# Patient Record
Sex: Female | Born: 1975 | Race: White | Hispanic: No | Marital: Single | Smoking: Current some day smoker
Health system: Southern US, Community
[De-identification: ages and names within clinical notes are randomized; demographics above are authoritative.]

## PROBLEM LIST (undated history)

## (undated) DIAGNOSIS — A499 Bacterial infection, unspecified: Secondary | ICD-10-CM

## (undated) DIAGNOSIS — N390 Urinary tract infection, site not specified: Secondary | ICD-10-CM

## (undated) HISTORY — DX: Urinary tract infection, site not specified: N39.0

## (undated) HISTORY — DX: Urinary tract infection, site not specified: A49.9

## (undated) HISTORY — DX: Bacterial infection, unspecified: A49.9

---

## 1998-11-03 HISTORY — PX: OTHER SURGICAL HISTORY: SHX169

## 1998-11-21 ENCOUNTER — Other Ambulatory Visit: Admission: RE | Admit: 1998-11-21 | Discharge: 1998-11-21 | Payer: Self-pay | Admitting: Obstetrics and Gynecology

## 1999-06-22 ENCOUNTER — Inpatient Hospital Stay (HOSPITAL_COMMUNITY): Admission: AD | Admit: 1999-06-22 | Discharge: 1999-06-24 | Payer: Self-pay | Admitting: *Deleted

## 1999-12-31 ENCOUNTER — Other Ambulatory Visit: Admission: RE | Admit: 1999-12-31 | Discharge: 1999-12-31 | Payer: Self-pay | Admitting: Obstetrics & Gynecology

## 2001-01-28 ENCOUNTER — Other Ambulatory Visit: Admission: RE | Admit: 2001-01-28 | Discharge: 2001-01-28 | Payer: Self-pay | Admitting: Obstetrics and Gynecology

## 2001-03-17 ENCOUNTER — Other Ambulatory Visit: Admission: RE | Admit: 2001-03-17 | Discharge: 2001-03-17 | Payer: Self-pay | Admitting: Obstetrics and Gynecology

## 2001-03-18 ENCOUNTER — Encounter (INDEPENDENT_AMBULATORY_CARE_PROVIDER_SITE_OTHER): Payer: Self-pay

## 2001-03-18 ENCOUNTER — Other Ambulatory Visit: Admission: RE | Admit: 2001-03-18 | Discharge: 2001-03-18 | Payer: Self-pay | Admitting: Obstetrics and Gynecology

## 2001-04-07 ENCOUNTER — Other Ambulatory Visit: Admission: RE | Admit: 2001-04-07 | Discharge: 2001-04-07 | Payer: Self-pay | Admitting: Obstetrics and Gynecology

## 2001-08-06 ENCOUNTER — Other Ambulatory Visit: Admission: RE | Admit: 2001-08-06 | Discharge: 2001-08-06 | Payer: Self-pay | Admitting: Obstetrics and Gynecology

## 2001-10-28 ENCOUNTER — Other Ambulatory Visit: Admission: RE | Admit: 2001-10-28 | Discharge: 2001-10-28 | Payer: Self-pay | Admitting: Obstetrics and Gynecology

## 2002-01-18 ENCOUNTER — Other Ambulatory Visit: Admission: RE | Admit: 2002-01-18 | Discharge: 2002-01-18 | Payer: Self-pay | Admitting: Obstetrics and Gynecology

## 2002-04-11 ENCOUNTER — Other Ambulatory Visit: Admission: RE | Admit: 2002-04-11 | Discharge: 2002-04-11 | Payer: Self-pay | Admitting: Obstetrics and Gynecology

## 2002-05-14 ENCOUNTER — Encounter: Payer: Self-pay | Admitting: Emergency Medicine

## 2002-05-14 ENCOUNTER — Emergency Department (HOSPITAL_COMMUNITY): Admission: EM | Admit: 2002-05-14 | Discharge: 2002-05-14 | Payer: Self-pay | Admitting: Emergency Medicine

## 2003-11-04 HISTORY — PX: INDUCED ABORTION: SHX677

## 2004-11-03 HISTORY — PX: OTHER SURGICAL HISTORY: SHX169

## 2004-12-24 ENCOUNTER — Other Ambulatory Visit: Admission: RE | Admit: 2004-12-24 | Discharge: 2004-12-24 | Payer: Self-pay | Admitting: Obstetrics and Gynecology

## 2005-06-13 ENCOUNTER — Encounter (INDEPENDENT_AMBULATORY_CARE_PROVIDER_SITE_OTHER): Payer: Self-pay | Admitting: Specialist

## 2005-06-13 ENCOUNTER — Ambulatory Visit (HOSPITAL_COMMUNITY): Admission: RE | Admit: 2005-06-13 | Discharge: 2005-06-13 | Payer: Self-pay | Admitting: Obstetrics and Gynecology

## 2005-06-18 ENCOUNTER — Inpatient Hospital Stay (HOSPITAL_COMMUNITY): Admission: AD | Admit: 2005-06-18 | Discharge: 2005-06-18 | Payer: Self-pay | Admitting: Obstetrics and Gynecology

## 2006-08-12 ENCOUNTER — Other Ambulatory Visit: Admission: RE | Admit: 2006-08-12 | Discharge: 2006-08-12 | Payer: Self-pay | Admitting: Obstetrics and Gynecology

## 2006-11-03 HISTORY — PX: OTHER SURGICAL HISTORY: SHX169

## 2006-12-18 ENCOUNTER — Inpatient Hospital Stay (HOSPITAL_COMMUNITY): Admission: AD | Admit: 2006-12-18 | Discharge: 2006-12-18 | Payer: Self-pay | Admitting: Obstetrics and Gynecology

## 2007-02-10 ENCOUNTER — Inpatient Hospital Stay (HOSPITAL_COMMUNITY): Admission: AD | Admit: 2007-02-10 | Discharge: 2007-02-10 | Payer: Self-pay | Admitting: Obstetrics and Gynecology

## 2007-02-23 ENCOUNTER — Inpatient Hospital Stay (HOSPITAL_COMMUNITY): Admission: AD | Admit: 2007-02-23 | Discharge: 2007-02-25 | Payer: Self-pay | Admitting: Obstetrics and Gynecology

## 2007-04-06 ENCOUNTER — Other Ambulatory Visit: Admission: RE | Admit: 2007-04-06 | Discharge: 2007-04-06 | Payer: Self-pay | Admitting: Obstetrics and Gynecology

## 2007-08-26 ENCOUNTER — Other Ambulatory Visit: Admission: RE | Admit: 2007-08-26 | Discharge: 2007-08-26 | Payer: Self-pay | Admitting: Obstetrics and Gynecology

## 2011-03-18 NOTE — Discharge Summary (Signed)
NAME:  Pamela Ochoa, Pamela Ochoa NO.:  [id number]   MEDICAL RECORD NO.:  [id number]          PATIENT TYPE:  INP   LOCATION:  9112                          FACILITY:  [facility]   PHYSICIAN:  Rudy Jew. Ashley Royalty, M.D.DATE OF BIRTH:  1976/06/11   DATE OF ADMISSION:  02/23/2007  DATE OF DISCHARGE:  02/25/2007                               DISCHARGE SUMMARY   DISCHARGE DIAGNOSIS:  1. Intrauterine pregnancy at 9 weeks' gestation, delivered  2. Term birth living child, vertex.   OPERATIONS AND PROCEDURES:  OB delivery   CONSULTATIONS:  None.   DISCHARGE MEDICATIONS:  None.   HISTORY AND PHYSICAL:  This is a 35 year old gravida 3, para 1-0-1-1 at  [redacted] weeks gestation.  The patient presented for induction secondary to  advanced cervical changes and musculoskeletal  discomfort.  Cervix was 4  cm dilated.   HISTORY AND PHYSICAL:  Please see chart.   HOSPITAL COURSE:  The patient was admitted to [facility] of  [place].  Initial laboratory studies were drawn.  Artificial rupture  of membranes was accomplished which revealed clear fluid.  IUPC was  placed.  The patient went on to deliver on 02/23/2007.  The infant was a  6 pounds 3 ounces female, Apgars 9 at 1 minute, 9 at 5 minutes, sent to  newborn nursery.  Delivery was accomplished by Dr. Sylvester Harder over  an intact perineum.  There were bilateral periurethral skids which  were easily closed.  The patient's postpartum course was benign.  She  was discharged on the second postpartum day, afebrile and in  satisfactory condition.   DISPOSITION:  The patient is to return to [facility] obstetrics in 4-6  weeks for postpartum evaluation.      James A. Ashley Royalty, M.D.  Electronically Signed     JAM/MEDQ  D:  03/31/2007  T:  03/31/2007  Job:  [id number]

## 2011-03-21 NOTE — Op Note (Signed)
NAME:  Pamela Ochoa, CRIMI NO.:  [id number]   MEDICAL RECORD NO.:  [id number]          PATIENT TYPE:  AMB   LOCATION:  SDC                           FACILITY:  [facility]   PHYSICIAN:  Crist Fat. Rivard, M.D. DATE OF BIRTH:  [birth year]   DATE OF PROCEDURE:  DATE OF DISCHARGE:                                 OPERATIVE REPORT   PREOPERATIVE DIAGNOSIS:  Missed abortion.   POSTOPERATIVE DIAGNOSIS:  Missed abortion.   ANESTHESIA:  Sedation.   PROCEDURE:  Dilation and evacuation.   SURGEON:  Dr. Estanislado Pandy.   ESTIMATED BLOOD LOSS:  Minimal.   PROCEDURE:  After being informed of the planned procedure with possible  complications including bleeding, infection, and injury to uterus, informed  consent was obtained. The patient was taken to OR #3 given IV sedation,  placed in the lithotomy position, prepped and draped in a sterile fashion,  and her bladder was emptied with an in-and-out Foley catheter. GYN exam  reveals an anteverted uterus 12-13 weeks' size, flared cervix, two normal  adnexa.  A weighted speculum was inserted.  The  anterior lip of the cervix  was grasped with a tenaculum forceps and paracervical block is performed  using Nesacaine 1% 20 mL in the usual fashion. The uterus was then sounded  at 12 cm and the cervix was easily dilated using Hegar dilators at #31. This  allowed easy entry of a #9 curved cannula and evacuation section of products  of conception. After evacuation was completed, a sharp curette is used to  assess the uterine cavity which is felt to be free of any debris.   Instruments are removed.  Instruments and sponge count is complete x2.  Estimated blood loss was minimal. Procedures was well tolerated by the  patient who was taken to recovery room in a well and stable condition.      Crist Fat Rivard, M.D.  Electronically Signed     SAR/MEDQ  D:  06/13/2005  T:  06/13/2005  Job:  [id number]

## 2014-11-03 HISTORY — PX: INDUCED ABORTION: SHX677

## 2015-10-17 ENCOUNTER — Encounter: Payer: Self-pay | Admitting: *Deleted

## 2015-10-17 ENCOUNTER — Encounter: Payer: Self-pay | Admitting: Neurology

## 2015-10-17 ENCOUNTER — Ambulatory Visit (INDEPENDENT_AMBULATORY_CARE_PROVIDER_SITE_OTHER): Payer: Self-pay | Admitting: Neurology

## 2015-10-17 VITALS — BP 110/70 | HR 80 | Ht 63.0 in | Wt 123.4 lb

## 2015-10-17 DIAGNOSIS — F0781 Postconcussional syndrome: Secondary | ICD-10-CM

## 2015-10-17 DIAGNOSIS — H9319 Tinnitus, unspecified ear: Secondary | ICD-10-CM

## 2015-10-17 DIAGNOSIS — F32A Depression, unspecified: Secondary | ICD-10-CM

## 2015-10-17 DIAGNOSIS — F411 Generalized anxiety disorder: Secondary | ICD-10-CM

## 2015-10-17 DIAGNOSIS — H539 Unspecified visual disturbance: Secondary | ICD-10-CM

## 2015-10-17 DIAGNOSIS — F329 Major depressive disorder, single episode, unspecified: Secondary | ICD-10-CM

## 2015-10-17 DIAGNOSIS — G4489 Other headache syndrome: Secondary | ICD-10-CM

## 2015-10-17 DIAGNOSIS — R42 Dizziness and giddiness: Secondary | ICD-10-CM

## 2015-10-17 DIAGNOSIS — S0990XA Unspecified injury of head, initial encounter: Secondary | ICD-10-CM

## 2015-10-17 DIAGNOSIS — H9192 Unspecified hearing loss, left ear: Secondary | ICD-10-CM

## 2015-10-17 NOTE — Patient Instructions (Signed)
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
## 2015-10-19 ENCOUNTER — Ambulatory Visit (INDEPENDENT_AMBULATORY_CARE_PROVIDER_SITE_OTHER): Payer: Self-pay

## 2015-10-19 DIAGNOSIS — S0990XA Unspecified injury of head, initial encounter: Secondary | ICD-10-CM

## 2015-10-19 DIAGNOSIS — H9319 Tinnitus, unspecified ear: Secondary | ICD-10-CM

## 2015-10-19 DIAGNOSIS — G4489 Other headache syndrome: Secondary | ICD-10-CM

## 2015-10-19 DIAGNOSIS — H9192 Unspecified hearing loss, left ear: Secondary | ICD-10-CM

## 2015-10-19 DIAGNOSIS — R42 Dizziness and giddiness: Secondary | ICD-10-CM

## 2015-10-19 DIAGNOSIS — Z0289 Encounter for other administrative examinations: Secondary | ICD-10-CM

## 2015-10-19 DIAGNOSIS — H539 Unspecified visual disturbance: Secondary | ICD-10-CM

## 2015-10-19 DIAGNOSIS — F0781 Postconcussional syndrome: Secondary | ICD-10-CM

## 2015-10-22 ENCOUNTER — Telehealth: Payer: Self-pay | Admitting: *Deleted

## 2015-10-22 NOTE — Telephone Encounter (Signed)
-----   Message from Anson FretAntonia B Ahern, MD sent at 10/21/2015 12:16 PM EST ----- Normal MRi of the brain thanks!

## 2015-10-22 NOTE — Telephone Encounter (Signed)
Called pt about normal MRA per Dr Lucia GaskinsAhern notes. Pt verbalized understanding. No further questions at this time.

## 2015-10-22 NOTE — Telephone Encounter (Signed)
Called and spoke to pt about normal MRI brain per Dr Ahern. Pt verbalized understanding.  

## 2015-10-22 NOTE — Telephone Encounter (Signed)
-----   Message from Anson FretAntonia B Ahern, MD sent at 10/22/2015  1:26 PM EST ----- MRA normal, no aneurysms or vascular anomalies or stenosis. Normal.  thanks

## 2017-01-21 ENCOUNTER — Ambulatory Visit (INDEPENDENT_AMBULATORY_CARE_PROVIDER_SITE_OTHER): Payer: Medicaid Other | Admitting: Obstetrics

## 2017-01-21 ENCOUNTER — Other Ambulatory Visit (HOSPITAL_COMMUNITY)
Admission: RE | Admit: 2017-01-21 | Discharge: 2017-01-21 | Disposition: A | Discharge status: Home / Self Care | Payer: Medicaid Other | Source: Ambulatory Visit | Attending: Obstetrics | Admitting: Obstetrics

## 2017-01-21 ENCOUNTER — Encounter: Payer: Self-pay | Admitting: Obstetrics

## 2017-01-21 VITALS — BP 128/84 | HR 71 | Ht 63.0 in | Wt 124.0 lb

## 2017-01-21 DIAGNOSIS — Z0000 Encounter for general adult medical examination without abnormal findings: Secondary | ICD-10-CM | POA: Diagnosis not present

## 2017-01-21 DIAGNOSIS — Z124 Encounter for screening for malignant neoplasm of cervix: Secondary | ICD-10-CM | POA: Diagnosis present

## 2017-01-21 DIAGNOSIS — Z01419 Encounter for gynecological examination (general) (routine) without abnormal findings: Secondary | ICD-10-CM | POA: Diagnosis not present

## 2017-01-21 DIAGNOSIS — Z113 Encounter for screening for infections with a predominantly sexual mode of transmission: Secondary | ICD-10-CM

## 2017-01-21 NOTE — Progress Notes (Signed)
Pt presents for annual, pap, all STD testing. No concerns per pt today.

## 2017-01-21 NOTE — Progress Notes (Signed)
Subjective:        Pamela EavesBrandi A Ochoa is a 41 y.o. female here for a routine exam.  Current complaints: None.    Personal health questionnaire:  Is patient [name], have a family history of breast and/or ovarian cancer: no Is there a family history of uterine cancer diagnosed at age < 5750, gastrointestinal cancer, urinary tract cancer, family member who is a Personnel officerLynch syndrome-associated carrier: no Is the patient overweight and hypertensive, family history of diabetes, personal history of gestational diabetes, preeclampsia or PCOS: no Is patient over 1155, have PCOS,  family history of premature CHD under age 39, diabetes, smoke, have hypertension or peripheral artery disease:  no At any time, has a partner hit, kicked or otherwise hurt or frightened you?: no Over the past 2 weeks, have you felt down, depressed or hopeless?: no Over the past 2 weeks, have you felt little interest or pleasure in doing things?:no   Gynecologic History Patient's last menstrual period was 01/15/2017. Contraception: vasectomy Last Pap: Unknown. Results were: normal Last mammogram: n/a. Results were: n/a  Obstetric History OB History  Gravida Para Term Preterm AB Living  5 2 2   3 2   SAB TAB Ectopic Multiple Live Births  1 2     2     # Outcome Date GA Lbr Len/2nd Weight Sex Delivery Anes PTL Lv  5 Term 02/23/07 2055w0d   F Vag-Spont EPI    4 Term 06/22/99 325w0d   M Vag-Spont EPI  LIV  3 SAB           2 TAB           1 TAB               Past Medical History:  Diagnosis Date  . Urinary tract bacterial infections     Past Surgical History:  Procedure Laterality Date  . INDUCED ABORTION  2005  . INDUCED ABORTION  2016  . miscarriage  2006  . OTHER SURGICAL HISTORY  2008   Birth  . vaginal birth  2000    No current outpatient prescriptions on file. Allergies  Allergen Reactions  . Other     Pain pills- makes her twitch really bad    Social History  Substance Use Topics  . Smoking  status: Never Smoker  . Smokeless tobacco: Never Used  . Alcohol use 0.0 oz/week     Comment: rare    Family History  Problem Relation Age of Onset  . Heart disease    . Diabetes    . Hypertension        Review of Systems  Constitutional: negative for fatigue and weight loss Respiratory: negative for cough and wheezing Cardiovascular: negative for chest pain, fatigue and palpitations Gastrointestinal: negative for abdominal pain and change in bowel habits Musculoskeletal:negative for myalgias Neurological: negative for gait problems and tremors Behavioral/Psych: negative for abusive relationship, depression Endocrine: negative for temperature intolerance    Genitourinary:negative for abnormal menstrual periods, genital lesions, hot flashes, sexual problems and vaginal discharge Integument/breast: negative for breast lump, breast tenderness, nipple discharge and skin lesion(s)    Objective:       BP 128/84   Pulse 71   Ht 5\' 3"  (1.6 m)   Wt 124 lb (56.2 kg)   LMP 01/15/2017   BMI 21.97 kg/m  General:   alert  Skin:   no rash or abnormalities  Lungs:   clear to auscultation bilaterally  Heart:   regular rate  and rhythm, S1, S2 normal, no murmur, click, rub or gallop  Breasts:   normal without suspicious masses, skin or nipple changes or axillary nodes  Abdomen:  normal findings: no organomegaly, soft, non-tender and no hernia  Pelvis:  External genitalia: normal general appearance Urinary system: urethral meatus normal and bladder without fullness, nontender Vaginal: normal without tenderness, induration or masses Cervix: normal appearance Adnexa: normal bimanual exam Uterus: anteverted and non-tender, normal size   Lab Review Urine pregnancy test Labs reviewed yes Radiologic studies reviewed no  50% of 20 min visit spent on counseling and coordination of care.    Assessment:    Healthy female exam.    Plan:    Education reviewed: calcium supplements, low  fat, low cholesterol diet, safe sex/STD prevention, self breast exams and weight bearing exercise. Contraception: vasectomy. Follow up in: 1 year.   No orders of the defined types were placed in this encounter.  Orders Placed This Encounter  Procedures  . Hepatitis B surface antigen  . Hepatitis C antibody  . HIV antibody  . RPR     Patient ID: Pamela Ochoa, female   DOB: September 02, 1976, 41 y.o.   MRN: [id number]

## 2017-01-22 LAB — CERVICOVAGINAL ANCILLARY ONLY
Bacterial vaginitis: POSITIVE — AB
Candida vaginitis: NEGATIVE
Chlamydia: NEGATIVE
Neisseria Gonorrhea: NEGATIVE
Trichomonas: POSITIVE — AB

## 2017-01-22 LAB — HEPATITIS B SURFACE ANTIGEN: Hepatitis B Surface Ag: NEGATIVE

## 2017-01-22 LAB — RPR: RPR: NONREACTIVE

## 2017-01-22 LAB — HIV ANTIBODY (ROUTINE TESTING W REFLEX): HIV SCREEN 4TH GENERATION: NONREACTIVE

## 2017-01-22 LAB — HEPATITIS C ANTIBODY: Hep C Virus Ab: <0.1 s/co ratio (ref 0.0–0.9)

## 2017-01-23 ENCOUNTER — Other Ambulatory Visit: Payer: Self-pay | Admitting: Obstetrics

## 2017-01-23 DIAGNOSIS — B9689 Other specified bacterial agents as the cause of diseases classified elsewhere: Secondary | ICD-10-CM

## 2017-01-23 DIAGNOSIS — A5901 Trichomonal vulvovaginitis: Secondary | ICD-10-CM

## 2017-01-23 DIAGNOSIS — N760 Acute vaginitis: Principal | ICD-10-CM

## 2017-01-23 MED ORDER — TINIDAZOLE 500 MG PO TABS: 2.0000 g | ORAL_TABLET | Freq: Every day | ORAL | 1 refills | Status: DC

## 2017-01-26 LAB — CYTOLOGY - PAP
Diagnosis: NEGATIVE
HPV (WINDOPATH): NOT DETECTED

## 2017-03-25 ENCOUNTER — Ambulatory Visit (INDEPENDENT_AMBULATORY_CARE_PROVIDER_SITE_OTHER): Payer: Medicaid Other | Admitting: Obstetrics

## 2017-03-25 ENCOUNTER — Other Ambulatory Visit (HOSPITAL_COMMUNITY)
Admission: RE | Admit: 2017-03-25 | Discharge: 2017-03-25 | Disposition: A | Discharge status: Home / Self Care | Payer: Medicaid Other | Source: Ambulatory Visit | Attending: Obstetrics | Admitting: Obstetrics

## 2017-03-25 ENCOUNTER — Encounter: Payer: Self-pay | Admitting: Obstetrics

## 2017-03-25 VITALS — BP 121/83 | HR 71 | Wt 123.8 lb

## 2017-03-25 DIAGNOSIS — N898 Other specified noninflammatory disorders of vagina: Secondary | ICD-10-CM

## 2017-03-25 DIAGNOSIS — Z113 Encounter for screening for infections with a predominantly sexual mode of transmission: Secondary | ICD-10-CM

## 2017-03-25 DIAGNOSIS — N760 Acute vaginitis: Secondary | ICD-10-CM | POA: Insufficient documentation

## 2017-03-25 DIAGNOSIS — B9689 Other specified bacterial agents as the cause of diseases classified elsewhere: Secondary | ICD-10-CM | POA: Insufficient documentation

## 2017-03-25 NOTE — Progress Notes (Signed)
Patient ID: Pamela Ochoa, female   DOB: 1976-08-10, 41 y.o.   MRN: [id number]  Chief Complaint  Patient presents with  . Follow-up    TOC    HPI Pamela Ochoa is a 41 y.o. female.  History of positive Trichomonas and BV, treated.  Partner was treated also.  Presents today for follow up TOC. HPI  Past Medical History:  Diagnosis Date  . Urinary tract bacterial infections     Past Surgical History:  Procedure Laterality Date  . INDUCED ABORTION  2005  . INDUCED ABORTION  2016  . miscarriage  2006  . OTHER SURGICAL HISTORY  2008   Birth  . vaginal birth  212000    Family History  Problem Relation Age of Onset  . Heart disease Unknown   . Diabetes Unknown   . Hypertension Unknown     Social History Social History  Substance Use Topics  . Smoking status: Never Smoker  . Smokeless tobacco: Never Used  . Alcohol use 0.0 oz/week     Comment: rare    Allergies  Allergen Reactions  . Other     Pain pills- makes her twitch really bad    Current Outpatient Prescriptions  Medication Sig Dispense Refill  . tinidazole (TINDAMAX) 500 MG tablet Take 4 tablets (2,000 mg total) by mouth daily with breakfast. 8 tablet 1   No current facility-administered medications for this visit.     Review of Systems Review of Systems Constitutional: negative for fatigue and weight loss Respiratory: negative for cough and wheezing Cardiovascular: negative for chest pain, fatigue and palpitations Gastrointestinal: negative for abdominal pain and change in bowel habits Genitourinary:positive for vaginal discharge and irritation Integument/breast: negative for nipple discharge Musculoskeletal:negative for myalgias Neurological: negative for gait problems and tremors Behavioral/Psych: negative for abusive relationship, depression Endocrine: negative for temperature intolerance      Blood pressure 121/83, pulse 71, weight 123 lb 12.8 oz (56.2 kg), last menstrual period  03/12/2017.  Physical Exam Physical Exam           General:  Alert and no distress Abdomen:  normal findings: no organomegaly, soft, non-tender and no hernia  Pelvis:  External genitalia: normal general appearance Urinary system: urethral meatus normal and bladder without fullness, nontender Vaginal: normal without tenderness, induration or masses Cervix: normal appearance Adnexa: normal bimanual exam Uterus: anteverted and non-tender, normal size    50% of 15 min visit spent on counseling and coordination of care.    Data Reviewed Labs  Assessment     History of positive Trichomonas and BV, treated. Follow up STD screen and TOC    Plan    Wet prep and cultures done F/U prn and in 1 year for Annual  No orders of the defined types were placed in this encounter.  No orders of the defined types were placed in this encounter.

## 2017-03-25 NOTE — Progress Notes (Signed)
Patient presents for TOC +BV and Trich. Her partner was also treated.

## 2017-03-26 LAB — CERVICOVAGINAL ANCILLARY ONLY
Bacterial vaginitis: POSITIVE — AB
CHLAMYDIA, DNA PROBE: NEGATIVE
Candida vaginitis: NEGATIVE
Neisseria Gonorrhea: NEGATIVE
Trichomonas: NEGATIVE

## 2017-03-27 ENCOUNTER — Other Ambulatory Visit: Payer: Self-pay | Admitting: Obstetrics

## 2018-02-11 ENCOUNTER — Ambulatory Visit (INDEPENDENT_AMBULATORY_CARE_PROVIDER_SITE_OTHER): Payer: Medicaid Other | Admitting: Obstetrics

## 2018-02-11 ENCOUNTER — Other Ambulatory Visit (HOSPITAL_COMMUNITY)
Admission: RE | Admit: 2018-02-11 | Discharge: 2018-02-11 | Disposition: A | Discharge status: Home / Self Care | Payer: Medicaid Other | Source: Ambulatory Visit | Attending: Obstetrics | Admitting: Obstetrics

## 2018-02-11 ENCOUNTER — Encounter: Payer: Self-pay | Admitting: Obstetrics

## 2018-02-11 VITALS — BP 126/75 | HR 78 | Ht 63.0 in | Wt 126.0 lb

## 2018-02-11 DIAGNOSIS — Z309 Encounter for contraceptive management, unspecified: Secondary | ICD-10-CM

## 2018-02-11 DIAGNOSIS — N898 Other specified noninflammatory disorders of vagina: Secondary | ICD-10-CM | POA: Insufficient documentation

## 2018-02-11 DIAGNOSIS — Z1239 Encounter for other screening for malignant neoplasm of breast: Secondary | ICD-10-CM

## 2018-02-11 DIAGNOSIS — F5231 Female orgasmic disorder: Secondary | ICD-10-CM

## 2018-02-11 DIAGNOSIS — IMO0002 Reserved for concepts with insufficient information to code with codable children: Secondary | ICD-10-CM

## 2018-02-11 DIAGNOSIS — Z01419 Encounter for gynecological examination (general) (routine) without abnormal findings: Secondary | ICD-10-CM | POA: Diagnosis present

## 2018-02-11 DIAGNOSIS — Z1151 Encounter for screening for human papillomavirus (HPV): Secondary | ICD-10-CM | POA: Insufficient documentation

## 2018-02-11 DIAGNOSIS — Z01411 Encounter for gynecological examination (general) (routine) with abnormal findings: Secondary | ICD-10-CM | POA: Insufficient documentation

## 2018-02-11 DIAGNOSIS — Z3009 Encounter for other general counseling and advice on contraception: Secondary | ICD-10-CM

## 2018-02-11 MED ORDER — SECNIDAZOLE 2 G PO PACK: 1.0000 | PACK | Freq: Once | ORAL | 2 refills | Status: DC

## 2018-02-11 MED ORDER — IBUPROFEN 800 MG PO TABS: 800.0000 mg | ORAL_TABLET | Freq: Three times a day (TID) | ORAL | 5 refills | Status: DC | PRN

## 2018-02-11 MED ORDER — METRONIDAZOLE 500 MG PO TABS: 500.0000 mg | ORAL_TABLET | Freq: Two times a day (BID) | ORAL | 2 refills | Status: DC

## 2018-02-11 NOTE — Progress Notes (Signed)
Patient presents for Annual Exam today.  C/O: possible BV infection  X 2wk and pain after (climax w/ intercourse). X 5 months.   LMP:02/01/2018 Contraception: None Last pap: 01/21/2017 STD testing: Declines  Mammogram: Has not had one yet   Pt made aware Family planning will not cover additional test and she may be billed pt agreeable to being billed.

## 2018-02-11 NOTE — Progress Notes (Signed)
Subjective:        Pamela Ochoa is a 42 y.o. female here for a routine exam.  Current complaints: Menstrual-like cramping with orgasm over the past 5 months.    Personal health questionnaire:  Is patient [name], have a family history of breast and/or ovarian cancer: no Is there a family history of uterine cancer diagnosed at age < 38, gastrointestinal cancer, urinary tract cancer, family member who is a Personnel officer syndrome-associated carrier: no Is the patient overweight and hypertensive, family history of diabetes, personal history of gestational diabetes, preeclampsia or PCOS: no Is patient over 47, have PCOS,  family history of premature CHD under age 45, diabetes, smoke, have hypertension or peripheral artery disease:  no At any time, has a partner hit, kicked or otherwise hurt or frightened you?: no Over the past 2 weeks, have you felt down, depressed or hopeless?: no Over the past 2 weeks, have you felt little interest or pleasure in doing things?:no   Gynecologic History Patient's last menstrual period was 02/01/2018 (exact date). Contraception: none Last Pap: 2018. Results were: normal Last mammogram: n/a. Results were: n/a  Obstetric History OB History  Gravida Para Term Preterm AB Living  5 2 2   3 2   SAB TAB Ectopic Multiple Live Births  1 2     2     # Outcome Date GA Lbr Len/2nd Weight Sex Delivery Anes PTL Lv  5 Term 02/23/07 [redacted]w[redacted]d   F Vag-Spont EPI    4 Term 06/22/99 [redacted]w[redacted]d   M Vag-Spont EPI  LIV  3 SAB           2 TAB           1 TAB             Past Medical History:  Diagnosis Date  . Urinary tract bacterial infections     Past Surgical History:  Procedure Laterality Date  . INDUCED ABORTION  2005  . INDUCED ABORTION  2016  . miscarriage  2006  . OTHER SURGICAL HISTORY  2008   Birth  . vaginal birth  2000     Current Outpatient Medications:  .  ibuprofen (ADVIL,MOTRIN) 800 MG tablet, Take 1 tablet (800 mg total) by mouth every 8  (eight) hours as needed., Disp: 30 tablet, Rfl: 5 .  metroNIDAZOLE (FLAGYL) 500 MG tablet, Take 1 tablet (500 mg total) by mouth 2 (two) times daily., Disp: 14 tablet, Rfl: 2 Allergies  Allergen Reactions  . Other     Pain pills- makes her twitch really bad    Social History   Tobacco Use  . Smoking status: Never Smoker  . Smokeless tobacco: Never Used  Substance Use Topics  . Alcohol use: Yes    Alcohol/week: 0.0 oz    Comment: rare    Family History  Problem Relation Age of Onset  . Heart disease Unknown   . Diabetes Unknown   . Hypertension Unknown       Review of Systems  Constitutional: negative for fatigue and weight loss Respiratory: negative for cough and wheezing Cardiovascular: negative for chest pain, fatigue and palpitations Gastrointestinal: negative for abdominal pain and change in bowel habits Musculoskeletal:negative for myalgias Neurological: negative for gait problems and tremors Behavioral/Psych: negative for abusive relationship, depression Endocrine: negative for temperature intolerance    Genitourinary:negative for abnormal menstrual periods, genital lesions, hot flashes, sexual problems and vaginal discharge.  Positive for menstrual-like pain during orgasm only Integument/breast: negative for breast lump,  breast tenderness, nipple discharge and skin lesion(s)    Objective:       BP 126/75   Pulse 78   Ht 5\' 3"  (1.6 m)   Wt 126 lb (57.2 kg)   LMP 02/01/2018 (Exact Date)   BMI 22.32 kg/m  General:   alert  Skin:   no rash or abnormalities  Lungs:   clear to auscultation bilaterally  Heart:   regular rate and rhythm, S1, S2 normal, no murmur, click, rub or gallop  Breasts:   normal without suspicious masses, skin or nipple changes or axillary nodes  Abdomen:  normal findings: no organomegaly, soft, non-tender and no hernia  Pelvis:  External genitalia: normal general appearance Urinary system: urethral meatus normal and bladder without  fullness, nontender Vaginal: normal without tenderness, induration or masses Cervix: normal appearance Adnexa: normal bimanual exam Uterus: anteverted and non-tender, normal size   Lab Review Urine pregnancy test Labs reviewed yes Radiologic studies reviewed no  50% of 20 min visit spent on counseling and coordination of care.   Assessment:     1. Encounter for routine gynecological examination with Papanicolaou smear of cervix Rx: - Cytology - PAP  2. Vaginal discharge Rx: - Cervicovaginal ancillary only - metroNIDAZOLE (FLAGYL) 500 MG tablet; Take 1 tablet (500 mg total) by mouth 2 (two) times daily.  Dispense: 14 tablet; Refill: 2  3. Orgasm dysfunction Rx: - ibuprofen (ADVIL,MOTRIN) 800 MG tablet; Take 1 tablet (800 mg total) by mouth every 8 (eight) hours as needed.  Dispense: 30 tablet; Refill: 5  4. Encounter for other general counseling and advice on contraception - has a bad emotional reactions to hormones.  Would like non hormonal contraceptive. - ParaGuard IUD recommended  5. Screening breast examination Rx: - MM Digital Screening; Future - MM DIAG BREAST TOMO BILATERAL; Future    Plan:    Education reviewed: calcium supplements, depression evaluation, low fat, low cholesterol diet, safe sex/STD prevention, self breast exams and weight bearing exercise. Contraception: considering options.  Wants non hormonal contraception. Follow up in: 1 year.   Meds ordered this encounter  Medications  . DISCONTD: Secnidazole (SOLOSEC) 2 g PACK    Sig: Take 1 packet by mouth once for 1 dose. Mix as directed with yogurt, pudding, applesauce, etc.    Dispense:  1 each    Refill:  2  . ibuprofen (ADVIL,MOTRIN) 800 MG tablet    Sig: Take 1 tablet (800 mg total) by mouth every 8 (eight) hours as needed.    Dispense:  30 tablet    Refill:  5  . metroNIDAZOLE (FLAGYL) 500 MG tablet    Sig: Take 1 tablet (500 mg total) by mouth 2 (two) times daily.    Dispense:  14 tablet     Refill:  2   Orders Placed This Encounter  Procedures  . MM Digital Screening    Standing Status:   Future    Standing Expiration Date:   04/14/2019    Order Specific Question:   Reason for Exam (SYMPTOM  OR DIAGNOSIS REQUIRED)    Answer:   Screening    Order Specific Question:   Is the patient pregnant?    Answer:   No    Order Specific Question:   Preferred imaging location?    Answer:   [facility]  . MM DIAG BREAST TOMO BILATERAL    Standing Status:   Future    Standing Expiration Date:   04/14/2019    Order Specific Question:  Reason for Exam (SYMPTOM  OR DIAGNOSIS REQUIRED)    Answer:   Screening    Order Specific Question:   Is the patient pregnant?    Answer:   No    Order Specific Question:   Preferred [facility]?    [facility]    Brock BadHARLES A. Aldine Grainger MD 02-11-2018

## 2018-02-12 LAB — CERVICOVAGINAL ANCILLARY ONLY
BACTERIAL VAGINITIS: POSITIVE — AB
CANDIDA VAGINITIS: NEGATIVE
Chlamydia: NEGATIVE
NEISSERIA GONORRHEA: NEGATIVE
TRICH (WINDOWPATH): NEGATIVE

## 2018-02-13 ENCOUNTER — Other Ambulatory Visit: Payer: Self-pay | Admitting: Obstetrics

## 2018-02-15 LAB — CYTOLOGY - PAP
Diagnosis: NEGATIVE
HPV: NOT DETECTED

## 2018-02-16 ENCOUNTER — Other Ambulatory Visit: Payer: Self-pay

## 2018-02-16 DIAGNOSIS — B9689 Other specified bacterial agents as the cause of diseases classified elsewhere: Secondary | ICD-10-CM

## 2018-02-16 DIAGNOSIS — N760 Acute vaginitis: Principal | ICD-10-CM

## 2018-02-16 MED ORDER — SECNIDAZOLE 2 G PO PACK: 1.0000 | PACK | Freq: Once | ORAL | 0 refills | Status: AC

## 2018-05-15 ENCOUNTER — Emergency Department (HOSPITAL_COMMUNITY)
Admission: EM | Admit: 2018-05-15 | Discharge: 2018-05-16 | Disposition: A | Discharge status: Home / Self Care | Payer: Medicaid Other | Attending: Emergency Medicine | Admitting: Emergency Medicine

## 2018-05-15 ENCOUNTER — Other Ambulatory Visit: Payer: Self-pay

## 2018-05-15 ENCOUNTER — Encounter (HOSPITAL_COMMUNITY): Payer: Self-pay

## 2018-05-15 DIAGNOSIS — F10920 Alcohol use, unspecified with intoxication, uncomplicated: Secondary | ICD-10-CM

## 2018-05-15 DIAGNOSIS — Y908 Blood alcohol level of 240 mg/100 ml or more: Secondary | ICD-10-CM | POA: Insufficient documentation

## 2018-05-15 LAB — ETHANOL: ALCOHOL ETHYL (B): 328 mg/dL — AB (ref <10)

## 2018-05-15 LAB — COMPREHENSIVE METABOLIC PANEL
ALBUMIN: 4.2 g/dL (ref 3.5–5.0)
ALT: 21 U/L (ref 0–44)
ANION GAP: 11 (ref 5–15)
AST: 41 U/L (ref 15–41)
Alkaline Phosphatase: 52 U/L (ref 38–126)
BUN: 7 mg/dL (ref 6–20)
CHLORIDE: 107 mmol/L (ref 98–111)
CO2: 24 mmol/L (ref 22–32)
Calcium: 8.3 mg/dL — ABNORMAL LOW (ref 8.9–10.3)
Creatinine, Ser: 0.88 mg/dL (ref 0.44–1.00)
GFR calc Af Amer: >60 mL/min (ref >60)
GLUCOSE: 112 mg/dL — AB (ref 70–99)
Potassium: 4.6 mmol/L (ref 3.5–5.1)
Sodium: 142 mmol/L (ref 135–145)
Total Bilirubin: 0.9 mg/dL (ref 0.3–1.2)
Total Protein: 7.3 g/dL (ref 6.5–8.1)

## 2018-05-15 LAB — I-STAT BETA HCG BLOOD, ED (MC, WL, AP ONLY)

## 2018-05-15 LAB — CBC
HCT: 39.3 % (ref 36.0–46.0)
HEMOGLOBIN: 13.3 g/dL (ref 12.0–15.0)
MCH: 31.2 pg (ref 26.0–34.0)
MCHC: 33.8 g/dL (ref 30.0–36.0)
MCV: 92.3 fL (ref 78.0–100.0)
Platelets: 337 10*3/uL (ref 150–400)
RBC: 4.26 MIL/uL (ref 3.87–5.11)
RDW: 13.4 % (ref 11.5–15.5)
WBC: 6.2 10*3/uL (ref 4.0–10.5)

## 2018-05-15 LAB — LIPASE, BLOOD: LIPASE: 45 U/L (ref 11–51)

## 2018-05-15 LAB — ACETAMINOPHEN LEVEL

## 2018-05-15 LAB — SALICYLATE LEVEL: Salicylate Lvl: <7 mg/dL (ref 2.8–30.0)

## 2018-05-15 MED ORDER — SODIUM CHLORIDE 0.9 % IV BOLUS
1000.0000 mL | Freq: Once | INTRAVENOUS | Status: AC
  Administered 2018-05-15: 1000 mL via INTRAVENOUS

## 2018-05-15 NOTE — ED Provider Notes (Signed)
[facility]-EMERGENCY DEPT Provider Note   CSN: [id number] Arrival date & time: 05/15/18  2107     History   Chief Complaint Chief Complaint  Patient presents with  . Alcohol Intoxication    HPI Pamela Ochoa is a 42 y.o. female with a history of depression who presents to the emergency department by EMS with altered mental status.  EMS was called by the patient's boyfriend's son because the patient was "unresponsive". EMS reports family states that the patient showed up at their home and was "already tipsy." She consumed a half bottle of wine at the pool when she suddenly didn't feel well. EMS reports shallow breathing and decreased response to pain on arrival that improved after she was briefly placed on Fithian, which was removed after breathing improved. They report improvement with response to painful stimuli without treatment. Tachycardic in 120s. CBG 140s. Mildly hypertensive in the 140s/80s.   It appears she may have had an episode of emesis prior to EMS arrival. She is protecting her airway. She is following commands and responding intermittently to questions appropriately. Moves all 4 extremities. Speech is slurred. She is unable to tell me how much alcohol she consumed today. Denies recreational or IVDU today. Denies IV or recreational drug use. Unable to answer to SI or HI at this time.   Level V caveat secondary to altered mental status.  The history is provided by the patient. No language interpreter was used.    Past Medical History:  Diagnosis Date  . Urinary tract bacterial infections     Patient Active Problem List   Diagnosis Date Noted  . Post concussion syndrome 10/17/2015  . Depression 10/17/2015  . Generalized anxiety disorder 10/17/2015    Past Surgical History:  Procedure Laterality Date  . INDUCED ABORTION  2005  . INDUCED ABORTION  2016  . miscarriage  2006  . OTHER SURGICAL HISTORY  2008   Birth  . vaginal birth  50      OB History    Gravida  5   Para  2   Term  2   Preterm      AB  3   Living  2     SAB  1   TAB  2   Ectopic      Multiple      Live Births  2            Home Medications    Prior to Admission medications   Medication Sig Start Date End Date Taking? Authorizing Provider  ibuprofen (ADVIL,MOTRIN) 800 MG tablet Take 1 tablet (800 mg total) by mouth every 8 (eight) hours as needed. 02/11/18   Brock Bad, MD  metroNIDAZOLE (FLAGYL) 500 MG tablet Take 1 tablet (500 mg total) by mouth 2 (two) times daily. 02/11/18   Brock Bad, MD    Family History Family History  Problem Relation Age of Onset  . Heart disease Unknown   . Diabetes Unknown   . Hypertension Unknown     Social History Social History   Tobacco Use  . Smoking status: Never Smoker  . Smokeless tobacco: Never Used  Substance Use Topics  . Alcohol use: Yes    Alcohol/week: 0.0 oz    Comment: rare  . Drug use: No     Allergies   Other   Review of Systems Review of Systems  Unable to perform ROS: Mental status change     Physical Exam Updated Vital  Signs BP 116/75   Pulse 81   Temp 97.9 F (36.6 C) (Oral)   Resp 13   SpO2 97%   Physical Exam  Constitutional: No distress.  Appears to have some residual emesis on her shirt.  Appears intoxicated.  HENT:  Head: Normocephalic.  Eyes: Pupils are equal, round, and reactive to light. Conjunctivae and EOM are normal. No scleral icterus.  Neck: Neck supple. No JVD present. No tracheal deviation present.  Cardiovascular: Regular rhythm. Tachycardia present. Exam reveals no gallop and no friction rub.  No murmur heard. Pulmonary/Chest: Effort normal. No stridor. No respiratory distress. She has no wheezes. She has no rales. She exhibits no tenderness.  Abdominal: Soft. She exhibits no distension and no mass. There is no tenderness. There is no rebound and no guarding. No hernia.  Neurological: She is alert.  GCS 15. A&O  x3.  Speech is slurred.  Moves all 4 extremities.  Sensation is intact and equal throughout.  Follows simple commands.  Skin: Skin is warm. Capillary refill takes less than 2 seconds. No rash noted.  Psychiatric: Her behavior is normal.  Nursing note and vitals reviewed.  ED Treatments / Results  Labs (all labs ordered are listed, but only abnormal results are displayed) Labs Reviewed  COMPREHENSIVE METABOLIC PANEL - Abnormal; Notable for the following components:      Result Value   Glucose, Bld 112 (*)    Calcium 8.3 (*)    All other components within normal limits  ETHANOL - Abnormal; Notable for the following components:   Alcohol, Ethyl (B) 328 (*)    All other components within normal limits  ACETAMINOPHEN LEVEL - Abnormal; Notable for the following components:   Acetaminophen (Tylenol), Serum <10 (*)    All other components within normal limits  CBC  SALICYLATE LEVEL  LIPASE, BLOOD  RAPID URINE DRUG SCREEN, HOSP PERFORMED  URINALYSIS, ROUTINE W REFLEX MICROSCOPIC  I-STAT BETA HCG BLOOD, ED ([facility], [facility], [facility] ONLY)    EKG None  Radiology No results found.  Procedures Procedures (including critical care time)  Medications Ordered in ED Medications  sodium chloride 0.9 % bolus 1,000 mL (has no administration in time range)  sodium chloride 0.9 % bolus 1,000 mL (1,000 mLs Intravenous New Bag/Given 05/15/18 2146)     Initial Impression / Assessment and Plan / ED Course  I have reviewed the triage vital signs and the nursing notes.  Pertinent labs & imaging results that were available during my care of the patient were reviewed by me and considered in my medical decision making (see chart for details).     42 year old female with no pertinent past medical history presenting by EMS with altered mental status.  EMS was called because the patient's boyfriend's son stated the patient was "unresponsive".  EMS reported improvement of response to painful stimuli and shallow  breathing throughout transport.  On my exam, the patient is following commands and answering most questions appropriately.  She is protecting her airway.  The patient was discussed with Dr. Gwenlyn FudgeGoldstein, attending physician.  However, she does not answer SI, HI, or auditory visual hallucinations at this time.  She appears intoxicated.  She denies any other recreational or IV drug use.  Will order IV fluid bolus and reevaluate.  Ethanol level 328.  Pregnancy test is negative.  UDS and UA are pending at this time.  Labs are otherwise unremarkable.  Second IV fluid bolus ordered.  [facility] the end of  my shift to reevaluate evaluate the patient after she is clinically sober for SI, HI, and auditory visual hallucinations.. Patient presentation, ED course, and plan of care discussed with review of all pertinent labs and imaging. Please see his/her note for further details regarding further ED course and disposition.  Final Clinical Impressions(s) / ED Diagnoses   Final diagnoses:  Alcoholic intoxication without complication [facility])    ED Discharge Orders    None       Jadon Ressler A, PA-C 05/15/18 2311    Pricilla Loveless, MD 05/15/18 2344

## 2018-05-15 NOTE — ED Notes (Signed)
Bed: [id number] Expected date:  Expected time:  Means of arrival:  Comments: 42 yo F/AMS-ETOH

## 2018-05-15 NOTE — ED Triage Notes (Signed)
Per ems: pt coming from home found unresponsive by boyfriend's son due to etoh use today. Reports she was at the pool and "only drank half a bottle of wine" when she suddenly didn't feel well. Reported no LOC or blood thinners.

## 2018-05-16 LAB — RAPID URINE DRUG SCREEN, HOSP PERFORMED
AMPHETAMINES: NOT DETECTED
Benzodiazepines: NOT DETECTED
Cocaine: NOT DETECTED
Opiates: NOT DETECTED
TETRAHYDROCANNABINOL: POSITIVE — AB

## 2018-05-16 LAB — URINALYSIS, ROUTINE W REFLEX MICROSCOPIC
BILIRUBIN URINE: NEGATIVE
Glucose, UA: NEGATIVE mg/dL
Hgb urine dipstick: NEGATIVE
KETONES UR: 5 mg/dL — AB
Leukocytes, UA: NEGATIVE
NITRITE: NEGATIVE
Protein, ur: NEGATIVE mg/dL
Specific Gravity, Urine: 1.01 (ref 1.005–1.030)
pH: 5 (ref 5.0–8.0)

## 2018-05-16 NOTE — ED Provider Notes (Signed)
5:03 AM Patient alert and oriented and asking to be discharged.  No reported SI or HI.  Patient has no complaints.  She is stable for discharge.   Roxy HorsemanBrowning, Dewel Lotter, PA-C 05/16/18 0503    Molpus, Jonny RuizJohn, MD 05/16/18 [phone]

## 2018-05-16 NOTE — ED Notes (Signed)
Pt is awake now and giving a urine sample

## 2019-02-14 ENCOUNTER — Ambulatory Visit: Payer: Self-pay | Admitting: Obstetrics

## 2019-04-21 ENCOUNTER — Telehealth: Payer: Self-pay

## 2019-04-21 DIAGNOSIS — B9689 Other specified bacterial agents as the cause of diseases classified elsewhere: Secondary | ICD-10-CM

## 2019-04-21 MED ORDER — METRONIDAZOLE 500 MG PO TABS: 500.0000 mg | ORAL_TABLET | Freq: Two times a day (BID) | ORAL | 0 refills | Status: DC

## 2019-04-21 NOTE — Telephone Encounter (Signed)
Patient called requesting a rx for BV. She states that her symptoms are fishy vaginal odor and discharge. She denies having any itching or irritation. Per protocol Rx sent to the pharmacy for Flagyl.

## 2019-06-13 ENCOUNTER — Telehealth: Payer: Self-pay

## 2019-06-13 DIAGNOSIS — B9689 Other specified bacterial agents as the cause of diseases classified elsewhere: Secondary | ICD-10-CM

## 2019-06-13 MED ORDER — METRONIDAZOLE 500 MG PO TABS: 500.0000 mg | ORAL_TABLET | Freq: Two times a day (BID) | ORAL | 0 refills | Status: DC

## 2019-06-13 NOTE — Telephone Encounter (Signed)
Returned call, pt complaining of vaginal discharge and odor, requesting rx for BV, sent per protocol. Advised pt that we are scheduling annual appts again and she is due, pt stated that she will call tomorrow to schedule.

## 2019-09-01 ENCOUNTER — Other Ambulatory Visit: Payer: Self-pay | Admitting: Obstetrics

## 2019-09-01 ENCOUNTER — Telehealth: Payer: Self-pay

## 2019-09-01 DIAGNOSIS — R300 Dysuria: Secondary | ICD-10-CM

## 2019-09-01 MED ORDER — CEFUROXIME AXETIL 500 MG PO TABS: 500.0000 mg | ORAL_TABLET | Freq: Two times a day (BID) | ORAL | 1 refills | Status: DC

## 2019-09-01 NOTE — Telephone Encounter (Signed)
S/w pt and advised that provider sent rx for uti

## 2019-09-01 NOTE — Telephone Encounter (Signed)
Ceftin Rx for UTI

## 2019-09-01 NOTE — Telephone Encounter (Signed)
Returned call, patient has an upcoming appt on 09-08-19. Pt reports that she is having uti symptoms. Complains of pain after urination, constant urge to urinate, back pain and chills. Pt is requesting rx and states that she needs something to help with these symptoms and cannot wait until her appt. Routed to provider for review.

## 2019-09-08 ENCOUNTER — Other Ambulatory Visit (HOSPITAL_COMMUNITY)
Admission: RE | Admit: 2019-09-08 | Discharge: 2019-09-08 | Disposition: A | Discharge status: Home / Self Care | Payer: Medicaid Other | Source: Ambulatory Visit | Attending: Obstetrics | Admitting: Obstetrics

## 2019-09-08 ENCOUNTER — Other Ambulatory Visit: Payer: Self-pay

## 2019-09-08 ENCOUNTER — Ambulatory Visit (INDEPENDENT_AMBULATORY_CARE_PROVIDER_SITE_OTHER): Payer: Medicaid Other | Admitting: Obstetrics

## 2019-09-08 ENCOUNTER — Encounter: Payer: Self-pay | Admitting: Obstetrics

## 2019-09-08 VITALS — BP 127/72 | HR 87 | Ht 63.0 in | Wt 126.4 lb

## 2019-09-08 DIAGNOSIS — R5382 Chronic fatigue, unspecified: Secondary | ICD-10-CM

## 2019-09-08 DIAGNOSIS — R350 Frequency of micturition: Secondary | ICD-10-CM

## 2019-09-08 DIAGNOSIS — N898 Other specified noninflammatory disorders of vagina: Secondary | ICD-10-CM | POA: Diagnosis not present

## 2019-09-08 DIAGNOSIS — Z0000 Encounter for general adult medical examination without abnormal findings: Secondary | ICD-10-CM | POA: Diagnosis not present

## 2019-09-08 DIAGNOSIS — F17200 Nicotine dependence, unspecified, uncomplicated: Secondary | ICD-10-CM

## 2019-09-08 DIAGNOSIS — Z113 Encounter for screening for infections with a predominantly sexual mode of transmission: Secondary | ICD-10-CM

## 2019-09-08 DIAGNOSIS — Z01419 Encounter for gynecological examination (general) (routine) without abnormal findings: Secondary | ICD-10-CM

## 2019-09-08 DIAGNOSIS — Z1239 Encounter for other screening for malignant neoplasm of breast: Secondary | ICD-10-CM

## 2019-09-08 LAB — POCT URINALYSIS DIPSTICK
Bilirubin, UA: NEGATIVE
Blood, UA: NEGATIVE
Glucose, UA: NEGATIVE
Ketones, UA: NEGATIVE
Leukocytes, UA: NEGATIVE
Nitrite, UA: NEGATIVE
Protein, UA: NEGATIVE
Spec Grav, UA: 1.01 (ref 1.010–1.025)
Urobilinogen, UA: 0.2 E.U./dL
pH, UA: 6.5 (ref 5.0–8.0)

## 2019-09-08 NOTE — Progress Notes (Signed)
Subjective:        Pamela Ochoa is a 43 y.o. female here for a routine exam.  Current complaints: Chronic fatigue.    Personal health questionnaire:  Is patient Pamela Ochoa, have a family history of breast and/or ovarian cancer: no Is there a family history of uterine cancer diagnosed at age < 39, gastrointestinal cancer, urinary tract cancer, family member who is a Field seismologist syndrome-associated carrier: no Is the patient overweight and hypertensive, family history of diabetes, personal history of gestational diabetes, preeclampsia or PCOS: no Is patient over 59, have PCOS,  family history of premature CHD under age 55, diabetes, smoke, have hypertension or peripheral artery disease:  yes At any time, has a partner hit, kicked or otherwise hurt or frightened you?: no Over the past 2 weeks, have you felt down, depressed or hopeless?: no Over the past 2 weeks, have you felt little interest or pleasure in doing things?:no   Gynecologic History Patient's last menstrual period was 08/16/2019. Contraception: vasectomy Last Pap: 02-11-2018. Results were: normal Last mammogram: None. Results were: None  Obstetric History OB History  Gravida Para Term Preterm AB Living  5 2 2   3 2   SAB TAB Ectopic Multiple Live Births  1 2     2     # Outcome Date GA Lbr Len/2nd Weight Sex Delivery Anes PTL Lv  5 Term 02/23/07 [redacted]w[redacted]d   F Vag-Spont EPI    4 Term 06/22/99 [redacted]w[redacted]d   M Vag-Spont EPI  LIV  3 SAB           2 TAB           1 TAB             Past Medical History:  Diagnosis Date  . Urinary tract bacterial infections     Past Surgical History:  Procedure Laterality Date  . INDUCED ABORTION  2005  . INDUCED ABORTION  2016  . miscarriage  2006  . OTHER SURGICAL HISTORY  2008   Birth  . vaginal birth  2000     Current Outpatient Medications:  .  ibuprofen (ADVIL,MOTRIN) 800 MG tablet, Take 1 tablet (800 mg total) by mouth every 8 (eight) hours as needed., Disp: 30 tablet, Rfl:  5 Allergies  Allergen Reactions  . Other     Pain pills- makes her twitch really bad "Oxycontin" makes me crazy    Social History   Tobacco Use  . Smoking status: Current Some Day Smoker  . Smokeless tobacco: Never Used  Substance Use Topics  . Alcohol use: Yes    Alcohol/week: 0.0 standard drinks    Comment: rare    Family History  Problem Relation Age of Onset  . Heart disease Other   . Diabetes Other   . Hypertension Other       Review of Systems  Constitutional: negative for fatigue and weight loss Respiratory: negative for cough and wheezing Cardiovascular: negative for chest pain, fatigue and palpitations Gastrointestinal: negative for abdominal pain and change in bowel habits Musculoskeletal:negative for myalgias Neurological: negative for gait problems and tremors Behavioral/Psych: negative for abusive relationship, depression Endocrine: negative for temperature intolerance    Genitourinary:negative for abnormal menstrual periods, genital lesions, hot flashes, sexual problems and vaginal discharge Integument/breast: negative for breast lump, breast tenderness, nipple discharge and skin lesion(s)    Objective:       BP 127/72   Pulse 87   Ht 5\' 3"  (1.6 m)   Wt 126  lb 6.4 oz (57.3 kg)   LMP 08/16/2019   BMI 22.39 kg/m  General:   alert  Skin:   no rash or abnormalities  Lungs:   clear to auscultation bilaterally  Heart:   regular rate and rhythm, S1, S2 normal, no murmur, click, rub or gallop  Breasts:   normal without suspicious masses, skin or nipple changes or axillary nodes  Abdomen:  normal findings: no organomegaly, soft, non-tender and no hernia  Pelvis:  External genitalia: normal general appearance Urinary system: urethral meatus normal and bladder without fullness, nontender Vaginal: normal without tenderness, induration or masses Cervix: normal appearance Adnexa: normal bimanual exam Uterus: anteverted and non-tender, normal size   Lab  Review Urine pregnancy test Labs reviewed yes Radiologic studies reviewed no  50% of 25 min visit spent on counseling and coordination of care.   Assessment:     1. Encounter for routine gynecological examination with Papanicolaou smear of cervix Rx: - Pap only w/ reflex HRHPV*LC  2. Vaginal discharge Rx: - Cervicovaginal ancillary only( [place])  3. Screen for STD (sexually transmitted disease) Rx: - Hepatitis B surface antigen - Hepatitis C antibody - HIV antibody - RPR  4. Urinary frequency Rx: - POCT Urinalysis Dipstick  5. Chronic fatigue Rx: - CBC  6. Tobacco dependence - tobacco cessation recommended with medication and behavioral modification  7. Screening breast examination Rx: - MM Digital Screening; Future    Plan:    Education reviewed: calcium supplements, depression evaluation, low fat, low cholesterol diet, safe sex/STD prevention, self breast exams, smoking cessation and weight bearing exercise. Contraception: considering either Tubal Sterilization or ParaGuard IUD. Mammogram ordered. Follow up in: 4 weeks.   TUBAL PAPERS SIGNED TODAY    Orders Placed This Encounter  Procedures  . MM Digital Screening    Standing Status:   Future    Standing Expiration Date:   11/07/2020    Order Specific Question:   Reason for Exam (SYMPTOM  OR DIAGNOSIS REQUIRED)    Answer:   Screening    Order Specific Question:   Is the patient pregnant?    Answer:   No    Order Specific Question:   Preferred imaging location?    Answer:   [facility]  . Hepatitis B surface antigen  . Hepatitis C antibody  . HIV antibody  . RPR  . CBC  . POCT Urinalysis Dipstick    Brock Bad, MD 09/08/2019 3:26 PM

## 2019-09-08 NOTE — Progress Notes (Signed)
Pt presents for AEX. Last Pap 02/11/18 Normal. Pt was treated for UTI in October, but she did not complete treatment due to the side effects. She only took 3 days of the Ceftin. She states that she is still having urinary frequency and urgency. She denies having any pain. Pt agrees to have vaginal swab completed. I did inform patient that her insurance will only cover GC/CH and not the Trich, yeast, or BV. Pt informed that she may receive a bill for those labs. Pt agrees. Pt would also like to be checked for anemia due to having fatigue. Pt aware that she may get a bill for CBC as well.

## 2019-09-09 ENCOUNTER — Other Ambulatory Visit (HOSPITAL_COMMUNITY)
Admission: RE | Admit: 2019-09-09 | Discharge: 2019-09-09 | Disposition: A | Discharge status: Home / Self Care | Payer: Medicaid Other | Source: Ambulatory Visit | Attending: Obstetrics | Admitting: Obstetrics

## 2019-09-09 DIAGNOSIS — Z01419 Encounter for gynecological examination (general) (routine) without abnormal findings: Secondary | ICD-10-CM | POA: Insufficient documentation

## 2019-09-09 DIAGNOSIS — Z1151 Encounter for screening for human papillomavirus (HPV): Secondary | ICD-10-CM | POA: Diagnosis not present

## 2019-09-09 LAB — HEPATITIS B SURFACE ANTIGEN: Hepatitis B Surface Ag: NEGATIVE

## 2019-09-09 LAB — CBC
Hematocrit: 39.4 % (ref 34.0–46.6)
Hemoglobin: 13.7 g/dL (ref 11.1–15.9)
MCH: 32.1 pg (ref 26.6–33.0)
MCHC: 34.8 g/dL (ref 31.5–35.7)
MCV: 92 fL (ref 79–97)
Platelets: 400 10*3/uL (ref 150–450)
RBC: 4.27 x10E6/uL (ref 3.77–5.28)
RDW: 12 % (ref 11.7–15.4)
WBC: 7.7 10*3/uL (ref 3.4–10.8)

## 2019-09-09 LAB — CERVICOVAGINAL ANCILLARY ONLY
Bacterial Vaginitis (gardnerella): POSITIVE — AB
Candida Glabrata: NEGATIVE
Candida Vaginitis: NEGATIVE
Chlamydia: NEGATIVE
Comment: NEGATIVE
Comment: NEGATIVE
Comment: NEGATIVE
Comment: NEGATIVE
Comment: NEGATIVE
Comment: NORMAL
Neisseria Gonorrhea: NEGATIVE
Trichomonas: NEGATIVE

## 2019-09-09 LAB — HIV ANTIBODY (ROUTINE TESTING W REFLEX): HIV Screen 4th Generation wRfx: NONREACTIVE

## 2019-09-09 LAB — HEPATITIS C ANTIBODY: Hep C Virus Ab: <0.1 s/co ratio (ref 0.0–0.9)

## 2019-09-09 LAB — RPR: RPR Ser Ql: NONREACTIVE

## 2019-09-09 NOTE — Addendum Note (Signed)
Addended by: Tristan Schroeder D on: 09/09/2019 08:55 AM   Modules accepted: Orders

## 2019-09-12 ENCOUNTER — Other Ambulatory Visit: Payer: Self-pay | Admitting: Obstetrics

## 2019-09-12 DIAGNOSIS — B9689 Other specified bacterial agents as the cause of diseases classified elsewhere: Secondary | ICD-10-CM

## 2019-09-12 MED ORDER — TINIDAZOLE 500 MG PO TABS: 1000.0000 mg | ORAL_TABLET | Freq: Every day | ORAL | 2 refills | Status: DC

## 2019-09-14 LAB — CYTOLOGY - PAP
Comment: NEGATIVE
Comment: NEGATIVE
Comment: NEGATIVE
Diagnosis: NEGATIVE
HPV 16: NEGATIVE
HPV 18 / 45: NEGATIVE
High risk HPV: POSITIVE — AB

## 2019-10-06 ENCOUNTER — Institutional Professional Consult (permissible substitution): Payer: Medicaid Other | Admitting: Obstetrics and Gynecology

## 2019-10-17 ENCOUNTER — Encounter: Payer: Self-pay | Admitting: Nurse Practitioner

## 2019-10-17 DIAGNOSIS — R8789 Other abnormal findings in specimens from female genital organs: Secondary | ICD-10-CM | POA: Insufficient documentation

## 2019-10-17 DIAGNOSIS — R87618 Other abnormal cytological findings on specimens from cervix uteri: Secondary | ICD-10-CM | POA: Insufficient documentation

## 2019-10-18 ENCOUNTER — Other Ambulatory Visit: Payer: Self-pay

## 2019-10-18 ENCOUNTER — Ambulatory Visit: Payer: Medicaid Other | Admitting: Nurse Practitioner

## 2019-10-18 ENCOUNTER — Encounter: Payer: Self-pay | Admitting: Nurse Practitioner

## 2019-10-18 ENCOUNTER — Ambulatory Visit (INDEPENDENT_AMBULATORY_CARE_PROVIDER_SITE_OTHER): Payer: Medicaid Other | Admitting: Nurse Practitioner

## 2019-10-18 VITALS — BP 122/89 | HR 79 | Wt 124.6 lb

## 2019-10-18 DIAGNOSIS — Z3202 Encounter for pregnancy test, result negative: Secondary | ICD-10-CM

## 2019-10-18 DIAGNOSIS — Z30430 Encounter for insertion of intrauterine contraceptive device: Secondary | ICD-10-CM

## 2019-10-18 LAB — POCT URINE PREGNANCY: Preg Test, Ur: NEGATIVE

## 2019-10-18 MED ORDER — PARAGARD INTRAUTERINE COPPER IU IUD: INTRAUTERINE_SYSTEM | Freq: Once | INTRAUTERINE | Status: AC

## 2019-10-18 NOTE — Progress Notes (Signed)
     GYNECOLOGY OFFICE PROCEDURE NOTE  Pamela Ochoa is a 43 y.o. [phone] here for Paragard IUD insertion.  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  No other gynecologic concerns. LMP 10-25-19.  Strong preference for IUD with no hormones due to problems with anxiety and depression with taking pills or Depo. Signed tubal papers at last visit but has decided she does not want to have a BTL.  IUD Insertion Procedure Note Patient identified, informed consent performed, consent signed.   Discussed risks of irregular bleeding, cramping, infection, malpositioning or misplacement of the IUD outside the uterus which may require further procedure such as laparoscopy. Time out was performed.  Urine pregnancy test negative.  Speculum placed in the vagina.  Cervix visualized.  Cleaned with Betadine x 3.  Hurricane spray used.  Grasped anteriorly with a single tooth tenaculum.  Uterus sounded to 8 cm.  Paragard IUD placed per manufacturer's recommendations.  Strings trimmed to 2-3 cm. Tenaculum was removed, good hemostasis noted.  Patient tolerated procedure well, but did have some anxiety and shaking after the procedure.  Admitted she saw sparkling lights during the procedure.  Was able to lie and rest until she stopped shaking (HX of anxiety disorder)..   Patient was given post-procedure instructions.  She was advised to have backup contraception for one week.  Patient was also asked to check IUD strings periodically and follow up in 4 weeks for IUD check.  Advised of High risk HPV noted on pap and advised of repeat pap with cotesting needed in one year.  Earlie Server, RN, MSN, NP-BC Nurse Practitioner, [facility] for [employer], [facility] 10/18/2019 3:42 PM

## 2019-10-18 NOTE — Progress Notes (Signed)
Pt presents for IUD insertion. Unprotected IC this morning Flu vaccine offered pt declined.

## 2019-11-18 ENCOUNTER — Ambulatory Visit: Payer: Medicaid Other | Admitting: Obstetrics and Gynecology

## 2019-11-23 ENCOUNTER — Other Ambulatory Visit: Payer: Self-pay

## 2019-11-23 ENCOUNTER — Ambulatory Visit (INDEPENDENT_AMBULATORY_CARE_PROVIDER_SITE_OTHER): Payer: Medicaid Other | Admitting: Obstetrics and Gynecology

## 2019-11-23 ENCOUNTER — Encounter: Payer: Self-pay | Admitting: Obstetrics and Gynecology

## 2019-11-23 VITALS — BP 106/53 | HR 79 | Ht 63.0 in | Wt 125.0 lb

## 2019-11-23 DIAGNOSIS — Z30431 Encounter for routine checking of intrauterine contraceptive device: Secondary | ICD-10-CM

## 2019-11-23 NOTE — Progress Notes (Signed)
    GYNECOLOGY OFFICE ENCOUNTER NOTE  History:  44 y.o. [id number] here today for today for IUD string check; Paragard  IUD was placed  10/17/2020. No complaints about the IUD, no concerning side effects.  The following portions of the patient's history were reviewed and updated as appropriate: allergies, current medications, past family history, past medical history, past social history, past surgical history and problem list. Last pap smear on 09/09/2019 was abnormal, +  HRHPV. Repeat pap in 1 year.   Review of Systems:  Pertinent items are noted in HPI.   Objective:  Physical Exam Blood pressure (!) 106/53, pulse 79, height 5\' 3"  (1.6 m), weight 125 lb (56.7 kg). CONSTITUTIONAL: Well-developed, well-nourished female in no acute distress.  HENT:  Normocephalic, atraumatic. External right and left ear normal. Oropharynx is clear and moist EYES: Conjunctivae and EOM are normal. Pupils are equal, round, and reactive to light. No scleral icterus.  NECK: Normal range of motion, supple, no masses CARDIOVASCULAR: Normal heart rate noted RESPIRATORY: Effort and breath sounds normal, no problems with respiration noted ABDOMEN: Soft, no distention noted.   PELVIC: Normal appearing external genitalia; normal appearing vaginal mucosa and cervix.  IUD strings visualized, about 2.5 cm in length outside cervix.   Assessment & Plan:  Patient to keep IUD in place for up to seven years; can come in for removal if she desires pregnancy earlier or for any concerning side effects.  Return for repeat pap in 1 year.   Eagle Pitta, , NP [facility], [facility]

## 2019-11-23 NOTE — Progress Notes (Signed)
GYN presents for Paragard IUD string check. Reports no problems today.

## 2019-11-28 ENCOUNTER — Other Ambulatory Visit: Payer: Self-pay | Admitting: Obstetrics

## 2019-11-28 DIAGNOSIS — N3000 Acute cystitis without hematuria: Secondary | ICD-10-CM

## 2020-06-12 ENCOUNTER — Telehealth: Payer: Self-pay

## 2020-06-12 NOTE — Telephone Encounter (Signed)
Attempted to contact patient regarding mammography scholarship fund, left message on voicemail requesting a return call.

## 2020-06-20 ENCOUNTER — Other Ambulatory Visit: Payer: Self-pay

## 2020-06-20 DIAGNOSIS — Z1231 Encounter for screening mammogram for malignant neoplasm of breast: Secondary | ICD-10-CM

## 2020-06-26 ENCOUNTER — Other Ambulatory Visit: Payer: Self-pay

## 2020-06-26 ENCOUNTER — Ambulatory Visit
Admission: RE | Admit: 2020-06-26 | Discharge: 2020-06-26 | Disposition: A | Discharge status: Home / Self Care | Payer: No Typology Code available for payment source | Source: Ambulatory Visit | Attending: Obstetrics and Gynecology | Admitting: Obstetrics and Gynecology

## 2020-06-26 DIAGNOSIS — Z1231 Encounter for screening mammogram for malignant neoplasm of breast: Secondary | ICD-10-CM

## 2020-06-29 ENCOUNTER — Other Ambulatory Visit: Payer: Self-pay | Admitting: Obstetrics and Gynecology

## 2020-06-29 DIAGNOSIS — R928 Other abnormal and inconclusive findings on diagnostic imaging of breast: Secondary | ICD-10-CM

## 2020-07-03 ENCOUNTER — Ambulatory Visit: Payer: Self-pay | Admitting: *Deleted

## 2020-07-03 ENCOUNTER — Other Ambulatory Visit: Payer: Self-pay

## 2020-07-03 VITALS — BP 110/78 | Temp 97.3°F | Wt 127.1 lb

## 2020-07-03 DIAGNOSIS — Z1239 Encounter for other screening for malignant neoplasm of breast: Secondary | ICD-10-CM

## 2020-07-03 NOTE — Patient Instructions (Signed)
Explained breast self awareness with Kayren Eaves. Patient did not need a Pap smear today due to last Pap smear was 09/09/2019. Let patient know that her next Pap smear is due in November due to her last Pap smear was HPV positive. Informed patient that BCCCP will cover her Pap smear and that she can call BCCCP to schedule. Referred patient to the [facility] for a right breast diagnostic mammogram per recommendation. Appointment scheduled Thursday, July 05, 2020 at 1450. Patient aware of appointment and will be there. Discussed smoking cessation with patient. Referred to the [facility] Quitline and gave resources to the free smoking cessation classes at [facility]. Kayren Eaves verbalized understanding.  Talajah Slimp, Kathaleen Maser, RN 10:08 AM

## 2020-07-03 NOTE — Progress Notes (Addendum)
Pamela Ochoa is a 44 y.o. female who presents to [facility] clinic today with no complaints.    Pap Smear: Pap smear not completed today. Last Pap smear was 09/09/2019 at [facility] and was normal with positive HPV. Per patient has history of an abnormal Pap smear 18 years ago that a colposcopy was completed for follow-up. Patient stated all Pap smears have been normal since colposcopy and has had at least three normal Pap smears. Last Pap smear result is available in Epic.   Physical exam: Breasts Breasts symmetrical. No skin abnormalities bilateral breasts. No nipple retraction bilateral breasts. No nipple discharge bilateral breasts. No lymphadenopathy. No lumps palpated bilateral breasts. No complaints of pain or tenderness on exam.       Pelvic/Bimanual Pap is not indicated today per BCCCP guidelines.   Smoking History: Patient currently vapes. Discussed smoking cessation with patient. Referred to the [facility] Quitline and gave resources to the free smoking cessation classes at [facility].   Patient Navigation: Patient education provided. Access to services provided for patient through BCCCP program.    Breast and Cervical Cancer Risk Assessment: Patient does not have family history of breast cancer, known genetic mutations, or radiation treatment to the chest before age 21. Per patient has a history of cervical dysplasia. Patient has no history of being immunocompromised or DES exposure in-utero.  Risk Assessment    Risk Scores      07/03/2020   Last edited by: Narda Rutherford, LPN   5-year risk: 0.4 %   Lifetime risk: 6.9 %          A: BCCCP exam without pap smear Patient referred to [facility] by the [facility] due to recommending additional imaging of the right breast for possible mass. Screening mammogram completed 06/26/2020.  P: Referred patient to the [facility] for a right breast diagnostic  mammogram per recommendation. Appointment scheduled Thursday, July 05, 2020 at 1450.  Priscille Heidelberg, RN 07/03/2020 10:47 AM

## 2020-07-05 ENCOUNTER — Other Ambulatory Visit: Payer: Self-pay | Admitting: Obstetrics and Gynecology

## 2020-07-05 ENCOUNTER — Ambulatory Visit
Admission: RE | Admit: 2020-07-05 | Discharge: 2020-07-05 | Disposition: A | Discharge status: Home / Self Care | Payer: No Typology Code available for payment source | Source: Ambulatory Visit | Attending: Obstetrics and Gynecology | Admitting: Obstetrics and Gynecology

## 2020-07-05 ENCOUNTER — Other Ambulatory Visit: Payer: Self-pay

## 2020-07-05 DIAGNOSIS — N6310 Unspecified lump in the right breast, unspecified quadrant: Secondary | ICD-10-CM

## 2020-07-05 DIAGNOSIS — R928 Other abnormal and inconclusive findings on diagnostic imaging of breast: Secondary | ICD-10-CM

## 2021-01-03 ENCOUNTER — Ambulatory Visit
Admission: RE | Admit: 2021-01-03 | Discharge: 2021-01-03 | Disposition: A | Discharge status: Home / Self Care | Payer: No Typology Code available for payment source | Source: Ambulatory Visit | Attending: Obstetrics and Gynecology | Admitting: Obstetrics and Gynecology

## 2021-01-03 ENCOUNTER — Other Ambulatory Visit: Payer: Self-pay

## 2021-01-03 ENCOUNTER — Other Ambulatory Visit: Payer: Self-pay | Admitting: Obstetrics and Gynecology

## 2021-01-03 DIAGNOSIS — N6310 Unspecified lump in the right breast, unspecified quadrant: Secondary | ICD-10-CM

## 2021-07-15 ENCOUNTER — Other Ambulatory Visit: Payer: No Typology Code available for payment source

## 2021-08-01 ENCOUNTER — Other Ambulatory Visit: Payer: Self-pay | Admitting: Obstetrics

## 2021-08-01 DIAGNOSIS — Z09 Encounter for follow-up examination after completed treatment for conditions other than malignant neoplasm: Secondary | ICD-10-CM

## 2021-08-21 ENCOUNTER — Other Ambulatory Visit: Payer: Self-pay

## 2021-10-23 IMAGING — MG DIGITAL SCREENING BILAT W/ TOMO W/ CAD
8 series · 9 of 24 positions shown · non-contrast
Comparison: None.

CLINICAL DATA: Screening. Baseline.

EXAM:
DIGITAL SCREENING BILATERAL MAMMOGRAM WITH TOMO AND CAD

[L MLO synth-2D]
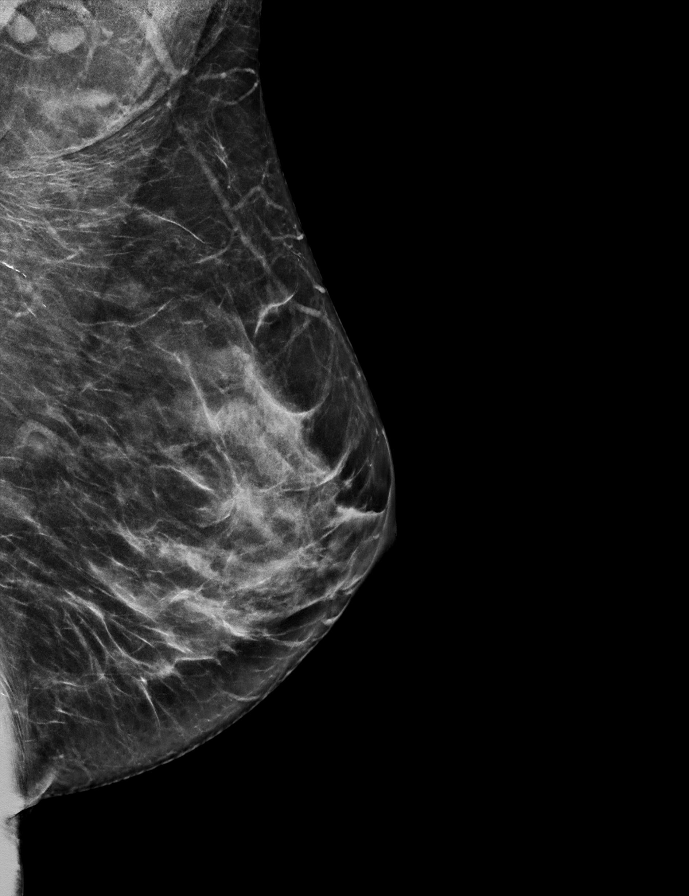

[R MLO synth-2D]
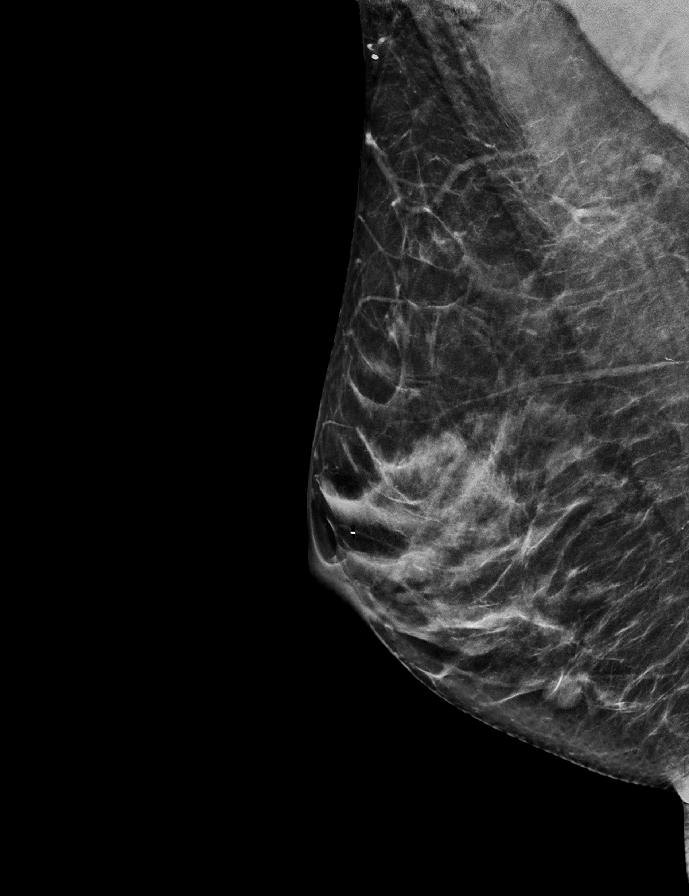

[L CC synth-2D]
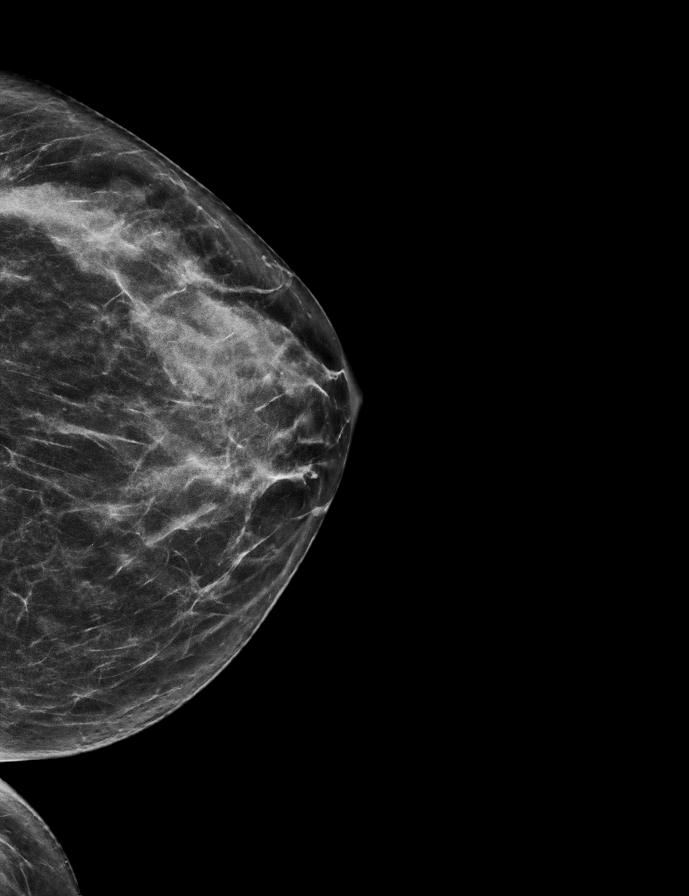

[R CC synth-2D]
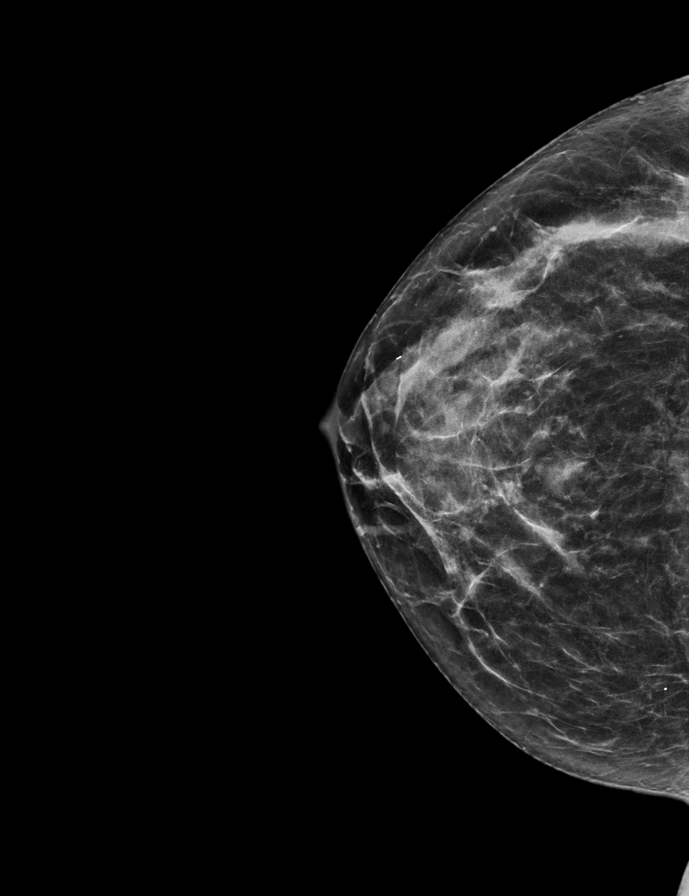

[L MLO tomo · 2 of 59 frames shown]
[frame 20/59]
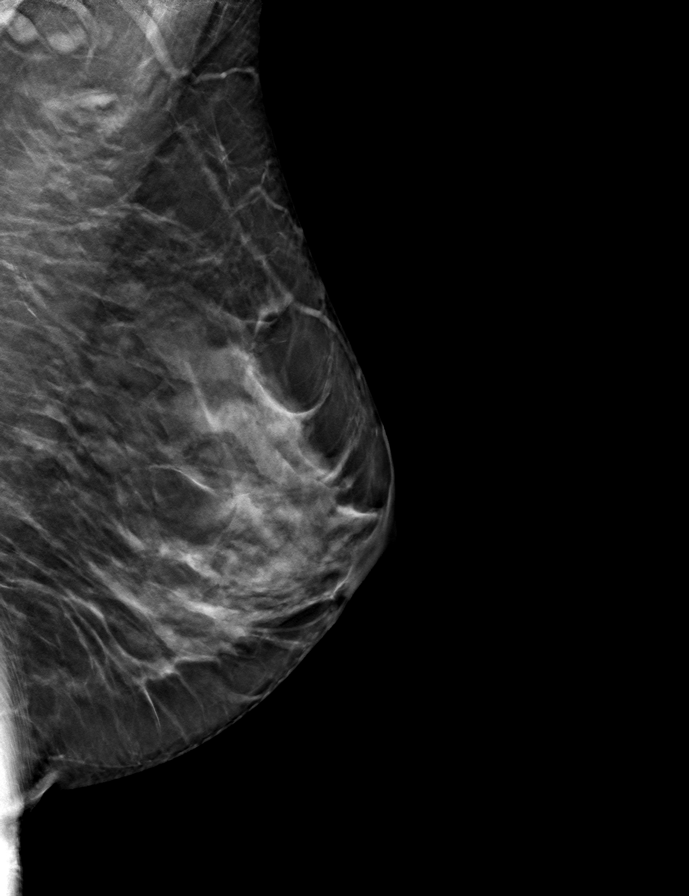
[frame 30/59]
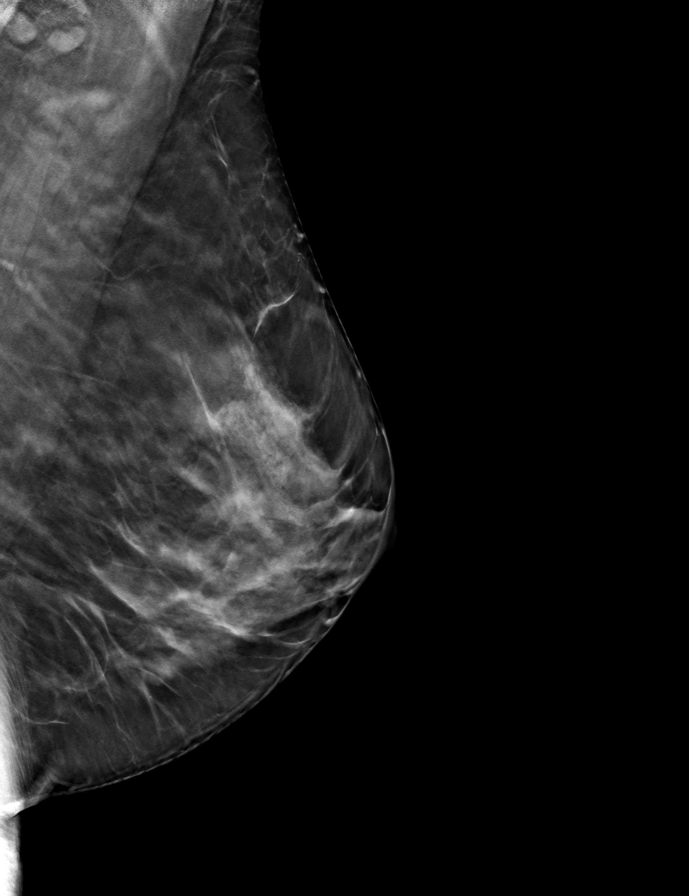

[R MLO tomo · tomo slice 30/59.0]
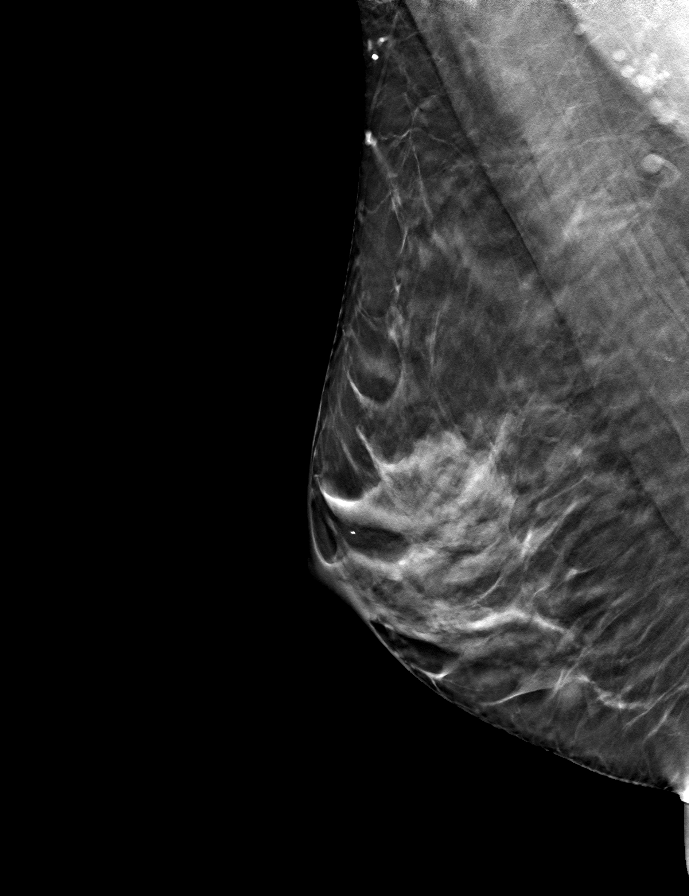

[R CC tomo · tomo slice 28/55.0]
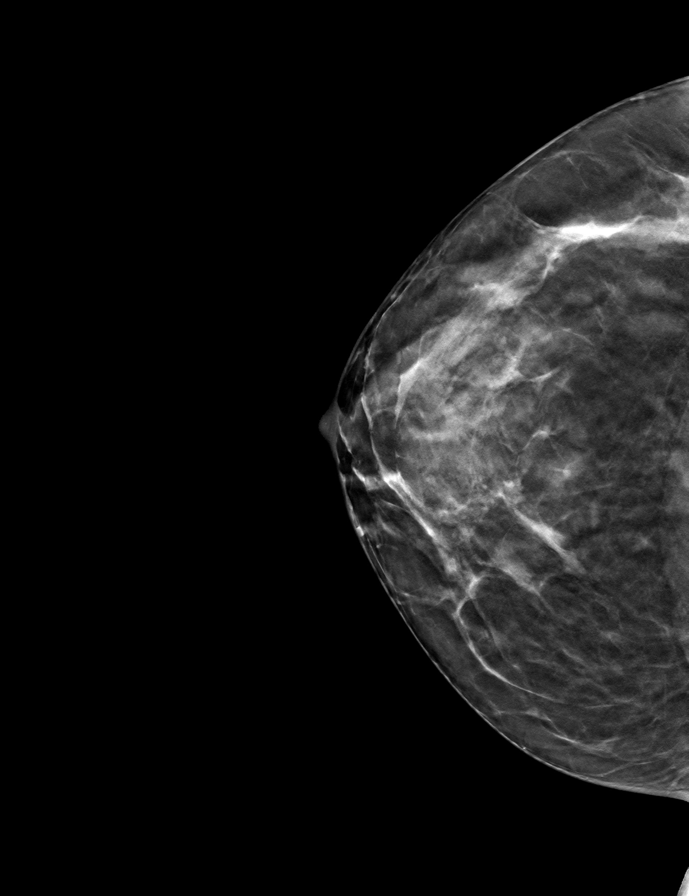

[L CC tomo · tomo slice 28/55.0]
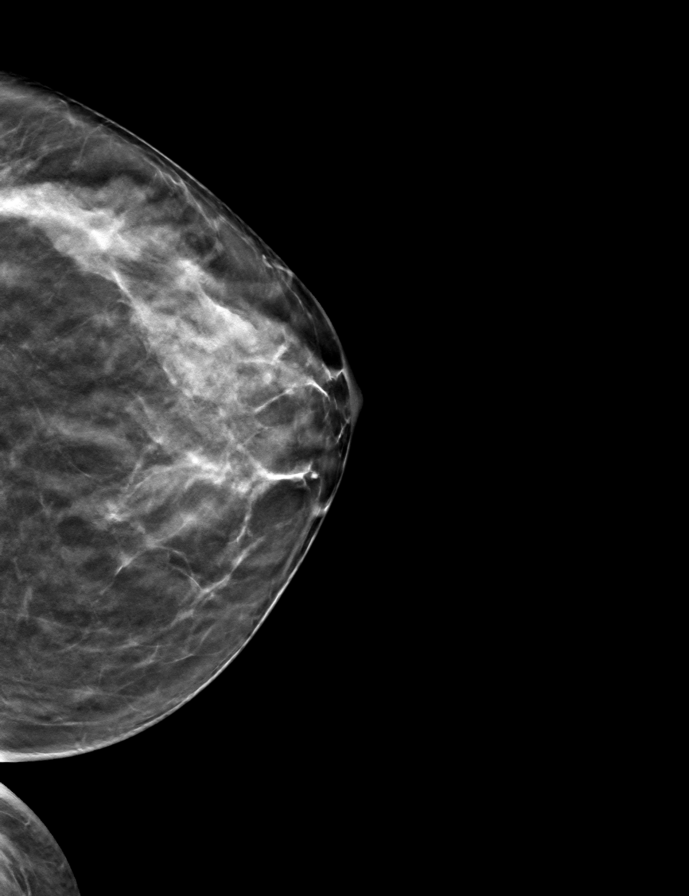

[9 of 24 positions shown; findings below may reference images not displayed]

ACR Breast Density Category c: The breast tissue is heterogeneously
dense, which may obscure small masses.
FINDINGS: In the right breast, a possible in the lower, slightly medial aspect
of the breast mass warrants further evaluation. In the left breast,
no findings suspicious for malignancy. Images were processed with
CAD.
IMPRESSION: Further evaluation is suggested for a possible mass in the right
breast.

RECOMMENDATION:
Diagnostic mammogram and possibly ultrasound of the right breast.
(Code:[id number])

The patient will be contacted regarding the findings, and additional
imaging will be scheduled.

BI-RADS CATEGORY  0: Incomplete. Need additional imaging evaluation
and/or prior mammograms for comparison.

## 2023-04-15 ENCOUNTER — Encounter: Payer: BC Managed Care – PPO | Admitting: Obstetrics

## 2023-04-17 DIAGNOSIS — H5213 Myopia, bilateral: Secondary | ICD-10-CM | POA: Diagnosis not present

## 2023-05-13 ENCOUNTER — Encounter: Payer: BC Managed Care – PPO | Admitting: Obstetrics

## 2023-06-09 ENCOUNTER — Ambulatory Visit (INDEPENDENT_AMBULATORY_CARE_PROVIDER_SITE_OTHER): Payer: Medicaid Other | Admitting: Obstetrics and Gynecology

## 2023-06-09 ENCOUNTER — Encounter: Payer: Self-pay | Admitting: Obstetrics and Gynecology

## 2023-06-09 ENCOUNTER — Other Ambulatory Visit (HOSPITAL_COMMUNITY)
Admission: RE | Admit: 2023-06-09 | Discharge: 2023-06-09 | Disposition: A | Discharge status: Home / Self Care | Payer: Medicaid Other | Source: Ambulatory Visit | Attending: Obstetrics | Admitting: Obstetrics

## 2023-06-09 VITALS — BP 129/83 | HR 72 | Ht 63.0 in | Wt 135.2 lb

## 2023-06-09 DIAGNOSIS — Z01419 Encounter for gynecological examination (general) (routine) without abnormal findings: Secondary | ICD-10-CM | POA: Insufficient documentation

## 2023-06-09 DIAGNOSIS — Z975 Presence of (intrauterine) contraceptive device: Secondary | ICD-10-CM | POA: Diagnosis not present

## 2023-06-09 DIAGNOSIS — N920 Excessive and frequent menstruation with regular cycle: Secondary | ICD-10-CM | POA: Diagnosis not present

## 2023-06-09 NOTE — Patient Instructions (Signed)
AREA FAMILY PRACTICE PHYSICIANS  [place] ([address]) [facility] [address] [place], [place] [address] [phone] Mon-Fri 8:30-12:30, 1:30-5:00 Accepting Medicaid [facility] [address], [place], [place] [address] [phone] Mon-Fri 8:00-5:30 [facility] [address] [place], [place] [address] [phone] Mon, Tue, Thur, Fri 8:30-5:00, Wed 10:00-7:00 (closed 1-2pm) Accepting Medicaid [facility] [address], [place], [place]  [address] Phone - [phone]   Fax - [phone]  [place] ([address]) [facility] [address] [place], [place] [address] [phone] Mon-Fri 8:00-5:00 [facility] ([facility])  [address] [place], [place] [address] [phone] Mon-Fri 8:30-5:30, Sat (Oct.-Mar.) 9:00-1:00 Accepting Medicaid  [place] ([address]) [facility] [address], [place], [place] [address] [phone] Mon-Fri 8:00-5:00  [place] ([address]) [facility] [employer] [address], [place], [place] [address] [phone] Mon-Fri 8:00-5:00 [facility] [address], [place], [place] [address] [phone] Mon-Fri 8:00-5:00 [facility] [address] [place], [place] [address] [phone] Mon-Fri 8:00-5:00 [facility] [address] [place] [address] [phone] Mon-Fri 7:30-5:30  [place] ([address] & [address]) [facility] [address] [place], [place] [address] [phone] Mon-Thur 8:00-6:00 Accepting Medicaid [facility] [address] [place], [place] [address] [phone] Mon-Thur 7:30-7:30, Fri 7:30-4:30 Accepting  Medicaid [facility] [place] [address], [place], [place]  [address] [phone]   Fax - [phone]  [place] ([address] & [address]) [facility] [facility] [address] [place], [place] [address] [phone] Mon-Fri 7:00-5:00 [facility] [address], [place], [place] [address] [phone] Mon-Fri 8:00-5:00 Accepting Medicaid [facility] [address], , [place] [address] [phone] Mon-Fri 8:00-5:00 Accepting Medicaid  [place] ([address]) [facility] [place] [address] [place], [place] [address] [phone] Mon-Fri 8:00-5:00 [facility] [address], [place], [place] [address] [phone] Mon-Fri 8:00-5:00 Accepting Medicaid [facility] ([facility] [address], [place], [place] [address] [phone] Mon-Fri 8:00-5:30, Sat&Sun by appointment (phones open at 8:30) Accepting Medicaid  [place] ([address]) [facility] [address] [place], [place] [address] [phone] Mon-Thur 8:00-7:00, Fri 8:00-5:00, Sat 8:00-12:00, Sun 9:00-12:00 Accepting Medicaid [facility] [place] [address], [place], [place] [address] [phone] Mon-Thur 8:00-5:00 Accepting Medicaid [facility] [address] [place], [place] [address] [phone] Mon-Fri 8:00-5:30, Sat (Oct.-Mar.) 9:00-1:00 Accepting Medicaid  [place] ([address]) [facility] [address], [place], [place] [address] [phone] Mon-Fri 8:00-5:00 Accepting Medicaid   [place] ([address]) [facility] [place] [address], [place], [place] [address] [phone] Mon-Fri 8:00-5:00 [facility] [place] [address], [place],  [address] [phone] Mon-Fri 8:00-5:00 [facility] [place] [address] [place] [address], [place],  [address] [phone] Mon-Fri 8:00-5:00 After hours clinic ([address] [place],  [address]) [phone] Mon-Fri 5:00-8:00, Sat 12:00-6:00, Sun 10:00-4:00 Accepting Medicaid [facility] [place]   [address], [place], [place]  [address] [phone]   Fax - [phone]  [place] ([address]) [facility] [place] [address], [place], [place] [address] [phone] Mon-Fri 8:00-5:00 [facility] [place] ([facility] [place]) [address], [place], [place] [address] [phone] Mon-Thur 8:00-7:00, Fri 8:00-5:00, Sat 8:00-12:00    

## 2023-06-09 NOTE — Progress Notes (Signed)
Pt. Presents spotting every other cycle for the last 4 yrs. Pt. Want to schedule a mammogram

## 2023-06-09 NOTE — Progress Notes (Signed)
ANNUAL EXAM Patient name: Pamela Ochoa MRN [id number]  Date of birth: [birth year] Chief Complaint:   Annual Exam  History of Present Illness:   Pamela Ochoa is a 47 y.o. [phone] with Patient's last menstrual period was 06/04/2023. being seen today for a routine annual exam.  Current complaints: Spotting w/ her periods. Has Cu IUD in place since 2020.    Upstream - 06/09/23 1403       Pregnancy Intention Screening   Does the patient want to become pregnant in the next year? No    Would the patient like to discuss contraceptive options today? N/A      Contraception Wrap Up   Current Method IUD or IUS    End Method IUD or IUS    Contraception Counseling Provided No    How was the end contraceptive method provided? N/A            The pregnancy intention screening data noted above was reviewed. Potential methods of contraception were discussed. The patient elected to proceed with IUD or IUS.   Last pap 09/09/2019. Results were: NILM w/ HRHPV positive: other (not 16, 18/45). H/O abnormal pap: yes +HPV as above Last mammogram: 07/05/20. Results were: abnormal BI RADS 3 . Family h/o breast cancer: no Last colonoscopy: never. Results were: N/A. Family h/o colorectal cancer: no     06/09/2023    2:01 PM  Depression screen PHQ 2/9  Decreased Interest 0  Down, Depressed, Hopeless 0  PHQ - 2 Score 0  Altered sleeping 0  Tired, decreased energy 0  Change in appetite 0  Feeling bad or failure about yourself  0  Trouble concentrating 0  Moving slowly or fidgety/restless 0  Suicidal thoughts 0  PHQ-9 Score 0        06/09/2023    2:02 PM  GAD 7 : Generalized Anxiety Score  Nervous, Anxious, on Edge 1     Review of Systems:   Pertinent items are noted in HPI Denies any headaches, blurred vision, fatigue, shortness of breath, chest pain, abdominal pain, abnormal vaginal discharge/itching/odor/irritation, problems with periods, bowel movements, urination, or intercourse  unless otherwise stated above. Pertinent History Reviewed:  Reviewed past medical,surgical, social and family history.  Reviewed problem list, medications and allergies. Physical Assessment:   Vitals:   06/09/23 1350  BP: 129/83  Pulse: 72  Weight: 135 lb 3.2 oz (61.3 kg)  Height: 5\' 3"  (1.6 m)  Body mass index is 23.95 kg/m.        Physical Examination:   General appearance - well appearing, and in no distress  Mental status - alert, oriented to person, place, and time  Chest - respiratory effort normal  Heart - normal peripheral perfusion  Breasts - breasts appear normal, no suspicious masses, no skin or nipple changes or axillary nodes  Abdomen - soft, nontender, nondistended, no masses or organomegaly  Pelvic - VULVA: normal appearing vulva with no masses, tenderness or lesions  VAGINA: normal appearing vagina with normal color and discharge, no lesions  CERVIX: normal appearing cervix without discharge or lesions, no CMT. IUD strings visualized.   Thin prep pap is done with HR HPV cotesting  UTERUS: uterus is felt to be normal size, shape, consistency and nontender. retroverted  ADNEXA: No adnexal masses or tenderness noted.  Chaperone present for exam  No results found for this or any previous visit (from the past 24 hour(s)).  Assessment & Plan:  1) Well-Woman Exam Mammogram: schedule screening  mammo as soon as possible Colonoscopy: schedule screening colonoscopy as soon as possible - referral placed Pap: collected GC/CT: collected HIV/HCV: declined  2) Spotting with IUD Reassurance provided. Normal exam today.  Labs/procedures today:  Orders Placed This Encounter  Procedures   MM 3D SCREENING MAMMOGRAM BILATERAL BREAST   Ambulatory referral to Gastroenterology   Follow-up: Return in about 1 year (around 06/08/2024) for annual exam.  Lennart Pall, MD 06/09/2023 2:50 PM

## 2023-06-25 ENCOUNTER — Ambulatory Visit
Admission: RE | Admit: 2023-06-25 | Discharge: 2023-06-25 | Disposition: A | Discharge status: Home / Self Care | Payer: Medicaid Other | Source: Ambulatory Visit | Attending: Obstetrics and Gynecology | Admitting: Obstetrics and Gynecology

## 2023-06-25 DIAGNOSIS — Z01419 Encounter for gynecological examination (general) (routine) without abnormal findings: Secondary | ICD-10-CM

## 2023-06-30 ENCOUNTER — Encounter: Payer: Self-pay | Admitting: Gastroenterology

## 2023-07-15 ENCOUNTER — Other Ambulatory Visit: Payer: Self-pay | Admitting: Obstetrics and Gynecology

## 2023-07-15 DIAGNOSIS — N6310 Unspecified lump in the right breast, unspecified quadrant: Secondary | ICD-10-CM

## 2023-07-21 ENCOUNTER — Ambulatory Visit
Admission: RE | Admit: 2023-07-21 | Discharge: 2023-07-21 | Disposition: A | Discharge status: Home / Self Care | Payer: Medicaid Other | Source: Ambulatory Visit | Attending: Obstetrics and Gynecology

## 2023-07-21 ENCOUNTER — Ambulatory Visit
Admission: RE | Admit: 2023-07-21 | Discharge: 2023-07-21 | Disposition: A | Discharge status: Home / Self Care | Payer: Medicaid Other | Source: Ambulatory Visit | Attending: Obstetrics and Gynecology | Admitting: Obstetrics and Gynecology

## 2023-07-21 DIAGNOSIS — N6310 Unspecified lump in the right breast, unspecified quadrant: Secondary | ICD-10-CM

## 2023-07-21 DIAGNOSIS — N6315 Unspecified lump in the right breast, overlapping quadrants: Secondary | ICD-10-CM | POA: Diagnosis not present

## 2023-07-21 DIAGNOSIS — R92333 Mammographic heterogeneous density, bilateral breasts: Secondary | ICD-10-CM | POA: Diagnosis not present

## 2023-08-05 ENCOUNTER — Ambulatory Visit: Payer: Medicaid Other | Admitting: Obstetrics and Gynecology

## 2023-08-07 ENCOUNTER — Ambulatory Visit (AMBULATORY_SURGERY_CENTER): Payer: Medicaid Other

## 2023-08-07 VITALS — Ht 63.0 in | Wt 132.0 lb

## 2023-08-07 DIAGNOSIS — Z1211 Encounter for screening for malignant neoplasm of colon: Secondary | ICD-10-CM

## 2023-08-07 NOTE — Progress Notes (Signed)

## 2023-08-20 ENCOUNTER — Other Ambulatory Visit: Payer: Self-pay

## 2023-08-20 ENCOUNTER — Telehealth: Payer: Self-pay | Admitting: Gastroenterology

## 2023-08-20 DIAGNOSIS — Z1211 Encounter for screening for malignant neoplasm of colon: Secondary | ICD-10-CM

## 2023-08-20 MED ORDER — NA SULFATE-K SULFATE-MG SULF 17.5-3.13-1.6 GM/177ML PO SOLN
1.0000 | Freq: Once | ORAL | 0 refills | Status: AC
Start: 2023-08-20 — End: 2023-08-20

## 2023-08-20 NOTE — Telephone Encounter (Signed)
Rx sent patient made aware

## 2023-08-20 NOTE — Telephone Encounter (Signed)
Patient needing prep sent into the pharmacy. Also requesting to speak with a nurse in regards to prep . Please advise.

## 2023-08-26 ENCOUNTER — Ambulatory Visit (AMBULATORY_SURGERY_CENTER): Payer: Medicaid Other | Admitting: Gastroenterology

## 2023-08-26 ENCOUNTER — Encounter: Payer: Self-pay | Admitting: Gastroenterology

## 2023-08-26 VITALS — BP 104/51 | HR 60 | Temp 98.4°F | Resp 11 | Ht 63.0 in | Wt 132.0 lb

## 2023-08-26 DIAGNOSIS — Z1211 Encounter for screening for malignant neoplasm of colon: Secondary | ICD-10-CM | POA: Diagnosis not present

## 2023-08-26 MED ORDER — SODIUM CHLORIDE 0.9 % IV SOLN: 500.0000 mL | Freq: Once | INTRAVENOUS | Status: DC

## 2023-08-26 NOTE — Patient Instructions (Addendum)
Resume previous diet. Continue present medications. Repeat colonoscopy in 10 years for screening purposes.   YOU HAD AN ENDOSCOPIC PROCEDURE TODAY AT THE [facility]:   Refer to the procedure report that was given to you for any specific questions about what was found during the examination.  If the procedure report does not answer your questions, please call your gastroenterologist to clarify.  If you requested that your care partner not be given the details of your procedure findings, then the procedure report has been included in a sealed envelope for you to review at your convenience later.  YOU SHOULD EXPECT: Some feelings of bloating in the abdomen. Passage of more gas than usual.  Walking can help get rid of the air that was put into your GI tract during the procedure and reduce the bloating. If you had a lower endoscopy (such as a colonoscopy or flexible sigmoidoscopy) you may notice spotting of blood in your stool or on the toilet paper. If you underwent a bowel prep for your procedure, you may not have a normal bowel movement for a few days.  Please Note:  You might notice some irritation and congestion in your nose or some drainage.  This is from the oxygen used during your procedure.  There is no need for concern and it should clear up in a day or so.  SYMPTOMS TO REPORT IMMEDIATELY:  Following lower endoscopy (colonoscopy or flexible sigmoidoscopy):  Excessive amounts of blood in the stool  Significant tenderness or worsening of abdominal pains  Swelling of the abdomen that is new, acute  Fever of 100F or higher  For urgent or emergent issues, a gastroenterologist can be reached at any hour by calling [phone]. Do not use MyChart messaging for urgent concerns.    DIET:  We do recommend a small meal at first, but then you may proceed to your regular diet.  Drink plenty of fluids but you should avoid alcoholic beverages for 24 hours.  ACTIVITY:  You should plan  to take it easy for the rest of today and you should NOT DRIVE or use heavy machinery until tomorrow (because of the sedation medicines used during the test).    FOLLOW UP: Our staff will call the number listed on your records the next business day following your procedure.  We will call around 7:15- 8:00 am to check on you and address any questions or concerns that you may have regarding the information given to you following your procedure. If we do not reach you, we will leave a message.     If any biopsies were taken you will be contacted by phone or by letter within the next 1-3 weeks.  Please call us at [phone] if you have not heard about the biopsies in 3 weeks.    SIGNATURES/CONFIDENTIALITY: You and/or your care partner have signed paperwork which will be entered into your electronic medical record.  These signatures attest to the fact that that the information above on your After Visit Summary has been reviewed and is understood.  Full responsibility of the confidentiality of this discharge information lies with you and/or your care-partner. 

## 2023-08-26 NOTE — Progress Notes (Signed)
[facility] History and Physical   Primary Care Physician:  Patient, No Pcp Per   Reason for Procedure:   Colon cancer screening  Plan:    Screening colonoscopy     HPI: YAHELI THON is a 47 y.o. female undergoing initial average risk screening colonoscopy.  She has no family history of colon cancer and no chronic GI symptoms.    Past Medical History:  Diagnosis Date   Urinary tract bacterial infections     Past Surgical History:  Procedure Laterality Date   INDUCED ABORTION  2005   INDUCED ABORTION  2016   miscarriage  2006   OTHER SURGICAL HISTORY  2008   Birth   vaginal birth  2000    Prior to Admission medications   Medication Sig Start Date End Date Taking? Authorizing Provider  cetirizine (ZYRTEC) 10 MG chewable tablet Chew 10 mg by mouth daily.   Yes [provider]  Cholecalciferol (VITAMIN D3) 250 MCG (10000 UT) capsule Take 10,000 Units by mouth daily.   Yes [provider]  CINNAMON PO Take by mouth.   Yes [provider]  Ginkgo Biloba (GNP GINGKO BILOBA EXTRACT PO) Take by mouth.   Yes [provider]  paragard intrauterine copper IUD IUD 1 each by Intrauterine route once.   Yes [provider]  acidophilus (RISAQUAD) CAPS capsule Take 1 capsule by mouth daily.    [provider]  Barberry-Oreg Grape-Goldenseal (BERBERINE COMPLEX PO) Take by mouth.    [provider]    Current Outpatient Medications  Medication Sig Dispense Refill   cetirizine (ZYRTEC) 10 MG chewable tablet Chew 10 mg by mouth daily.     Cholecalciferol (VITAMIN D3) 250 MCG (10000 UT) capsule Take 10,000 Units by mouth daily.     CINNAMON PO Take by mouth.     Ginkgo Biloba (GNP GINGKO BILOBA EXTRACT PO) Take by mouth.     paragard intrauterine copper IUD IUD 1 each by Intrauterine route once.     acidophilus (RISAQUAD) CAPS capsule Take 1 capsule by mouth daily.     Barberry-Oreg Grape-Goldenseal  (BERBERINE COMPLEX PO) Take by mouth.     Current Facility-Administered Medications  Medication Dose Route Frequency Provider Last Rate Last Admin   0.9 %  sodium chloride infusion  500 mL Intravenous Once Jenel Lucks, MD        Allergies as of 08/26/2023 - Review Complete 08/26/2023  Allergen Reaction Noted   Other  10/17/2015    Family History  Problem Relation Age of Onset   Heart disease Other    Diabetes Other    Hypertension Other    Colon cancer Neg Hx    Colon polyps Neg Hx    Esophageal cancer Neg Hx    Rectal cancer Neg Hx    Stomach cancer Neg Hx     Social History   Socioeconomic History   Marital status: Single    Spouse name: Not on file   Number of children: 2   Years of education: associates   Highest education level: Associate degree: occupational, Scientist, product/process development, or vocational program  Occupational History   Not on file  Tobacco Use   Smoking status: Some Days   Smokeless tobacco: Never  Vaping Use   Vaping status: Some Days   Substances: Nicotine  Substance and Sexual Activity   Alcohol use: Yes    Comment: social   Drug use: No   Sexual activity: Yes    Partners: Male  Birth control/protection: None, I.U.D.  Other Topics Concern   Not on file  Social History Narrative   Lives with mother and father   Caffeine use: rare   Social Determinants of Health   Financial Resource Strain: Not on file  Food Insecurity: Not on file  Transportation Needs: No Transportation Needs (07/03/2020)   PRAPARE - Administrator, Civil Service (Medical): No    Lack of Transportation (Non-Medical): No  Physical Activity: Not on file  Stress: Not on file  Social Connections: Not on file  Intimate Partner Violence: Not on file    Review of Systems:  All other review of systems negative except as mentioned in the HPI.  Physical Exam: Vital signs BP 124/87 (BP Location: Right Arm, Patient Position: Sitting, Cuff Size: Normal)   Pulse 84    Temp 98.4 F (36.9 C) (Temporal)   Ht 5\' 3"  (1.6 m)   Wt 132 lb (59.9 kg)   SpO2 97%   BMI 23.38 kg/m   General:   Alert,  Well-developed, well-nourished, pleasant and cooperative in NAD Airway:  Mallampati 1 Lungs:  Clear throughout to auscultation.   Heart:  Regular rate and rhythm; no murmurs, clicks, rubs,  or gallops. Abdomen:  Soft, nontender and nondistended. Normal bowel sounds.   Neuro/Psych:  Normal mood and affect. A and O x 3   Cayli Escajeda E. Tomasa Rand, MD [facility]

## 2023-08-26 NOTE — Progress Notes (Signed)
Uneventful anesthetic. Report to pacu rn. Vss. Care resumed by rn. 

## 2023-08-26 NOTE — Op Note (Signed)
[facility] Patient Name: Pamela Ochoa Procedure Date: 08/26/2023 10:14 AM MRN: [id number] Endoscopist: Lorin Picket E. Tomasa Rand , MD, [id number] Age: 47 Referring MD:  Date of Birth: 1976-04-13 Gender: Female Account #: [id number] Procedure:                Colonoscopy Indications:              Screening for colorectal malignant neoplasm, This                            is the patient's first colonoscopy Medicines:                Monitored Anesthesia Care Procedure:                Pre-Anesthesia Assessment:                           - Prior to the procedure, a History and Physical                            was performed, and patient medications and                            allergies were reviewed. The patient's tolerance of                            previous anesthesia was also reviewed. The risks                            and benefits of the procedure and the sedation                            options and risks were discussed with the patient.                            All questions were answered, and informed consent                            was obtained. Prior Anticoagulants: The patient has                            taken no anticoagulant or antiplatelet agents. ASA                            Grade Assessment: I - A normal, healthy patient.                            After reviewing the risks and benefits, the patient                            was deemed in satisfactory condition to undergo the                            procedure.  After obtaining informed consent, the colonoscope                            was passed under direct vision. Throughout the                            procedure, the patient's blood pressure, pulse, and                            oxygen saturations were monitored continuously. The                            Olympus Scope SN: [id number] was introduced through                            the anus and advanced to  the the terminal ileum,                            with identification of the appendiceal orifice and                            IC valve. The colonoscopy was performed without                            difficulty. The patient tolerated the procedure                            well. The quality of the bowel preparation was                            excellent. The terminal ileum, ileocecal valve,                            appendiceal orifice, and rectum were photographed.                            The bowel preparation used was SUPREP via split                            dose instruction. Scope In: 10:25:28 AM Scope Out: 10:39:59 AM Scope Withdrawal Time: 0 hours 7 minutes 50 seconds  Total Procedure Duration: 0 hours 14 minutes 31 seconds  Findings:                 The perianal and digital rectal examinations were                            normal. Pertinent negatives include normal                            sphincter tone and no palpable rectal lesions.                           The colon (entire examined portion) appeared normal.  The terminal ileum appeared normal.                           The retroflexed view of the distal rectum and anal                            verge was normal and showed no anal or rectal                            abnormalities. Complications:            No immediate complications. Estimated Blood Loss:     Estimated blood loss: none. Impression:               - The entire examined colon is normal.                           - The examined portion of the ileum was normal.                           - The distal rectum and anal verge are normal on                            retroflexion view.                           - No specimens collected. Recommendation:           - Patient has a contact number available for                            emergencies. The signs and symptoms of potential                            delayed  complications were discussed with the                            patient. Return to normal activities tomorrow.                            Written discharge instructions were provided to the                            patient.                           - Resume previous diet.                           - Continue present medications.                           - Repeat colonoscopy in 10 years for screening                            purposes. Shirelle Tootle E. Tomasa Rand, MD 08/26/2023 10:43:37 AM This report has been signed electronically.

## 2023-08-26 NOTE — Progress Notes (Signed)
History reviewed 

## 2023-08-27 ENCOUNTER — Telehealth: Payer: Self-pay

## 2023-08-27 NOTE — Telephone Encounter (Signed)
  Follow up Call-     08/26/2023   10:00 AM 08/26/2023    9:49 AM  Call back number  Post procedure Call Back phone  # [phone]   Permission to leave phone message  Yes     Patient questions:  Do you have a fever, pain , or abdominal swelling? No. Pain Score  0 *  Have you tolerated food without any problems? Yes.    Have you been able to return to your normal activities? Yes.    Do you have any questions about your discharge instructions: Diet   No. Medications  No. Follow up visit  No.  Do you have questions or concerns about your Care? No.  Actions: * If pain score is 4 or above: No action needed, pain <4.
